# Patient Record
Sex: Female | Born: 1950 | Race: White | Hispanic: No | Smoking: Current every day smoker
Health system: Southern US, Community
[De-identification: ages and names within clinical notes are randomized; demographics above are authoritative.]

---

## 2006-12-17 ENCOUNTER — Inpatient Hospital Stay (HOSPITAL_COMMUNITY): Admission: RE | Admit: 2006-12-17 | Discharge: 2006-12-19 | Payer: Self-pay | Admitting: Neurosurgery

## 2010-10-10 NOTE — Op Note (Signed)
NAMEROZELLE, CAUDLE                  ACCOUNT NO.:  [id number]   MEDICAL RECORD NO.:  [id number]          PATIENT TYPE:  INP   LOCATION:  3172                         FACILITY:  [facility]   PHYSICIAN:  Payton Doughty, M.D.      DATE OF BIRTH:  04/19/1951   DATE OF PROCEDURE:  12/17/2006  DATE OF DISCHARGE:                               OPERATIVE REPORT   PREOPERATIVE DIAGNOSIS:  Spondylosis C5-C6 and C6-C7.   POSTOPERATIVE DIAGNOSIS:  Spondylosis C5-C6 and C6-C7.   PROCEDURE:  C5-C6 and C6-C7 anterior cervical decompression and fusion  with Reflex hybrid plate.   SURGEON:  Payton Doughty, M.D.   SURGEON:  Clydene Fake, M.D.  [place]   ANESTHESIA:  General endotracheal anesthesia.   PREPARATION:  Betadine prep with alcohol wipe.   COMPLICATIONS:  None.   BODY OF TEXT:  This is a 60 year old lady with severe cervical  spondylosis.  She is taken to the operating room, after smoothly  anesthetized and intubated, placed spine on the operating table in  halter head traction.  Following shave, prep, and drape in the usual  sterile fashion, the skin was incised from the midline to the medial  border of the sternocleidomastoid muscle on the left just at the level  of the carotid tubercle.  The platysma was identified, elevated,  divided, and undermined.  Sternocleidomastoid was identified and medial  dissection revealed the carotid artery retracted lateral to the left,  the trachea and esophagus retracted laterally to the right, exposing the  bones of the anterior cervical spine.  A marker was placed.  Intraoperative x-ray obtained to confirm the correctness of the level.  Having confirmed the correctness of the level, a Shadowline self-  retaining retractor was placed and discectomy carried out at C5-C6 and  C6-C7. The microscope was then brought in.  We used microdissection  technique to complete the discectomy, explore the neural foramen, and  divide the posterior annulus and the  posterior longitudinal ligament.  The neural foramen were carefully explored bilaterally and found to be  open.  A 7 mm bone graft was fashioned with patellar allograft and  tapped into place.  A 32 mm Reflex hybrid plate was placed with 12 mm  screws, two at C5, two at C6, and two at C7.  Intraoperative x-ray  showed good placement of bone graft, plate and screws.  Successive  layers of 3-0 Vicryl and 4-0 Vicryl.  Benzoin and Steri-Strips were  placed and made occlusive with Telfa and OpSite.  The patient returned  to the recovery room in good condition.           ______________________________  Payton Doughty, M.D.    MWR/MEDQ  D:  12/17/2006  T:  12/17/2006  Job:  [id number]

## 2010-10-10 NOTE — H&P (Signed)
Catherine Wallace, Catherine Wallace                  ACCOUNT NO.:  [id number]   MEDICAL RECORD NO.:  [id number]          PATIENT TYPE:  INP   LOCATION:  3172                         FACILITY:  [facility]   PHYSICIAN:  Payton Doughty, M.D.      DATE OF BIRTH:  May 21, 1951   DATE OF ADMISSION:  12/17/2006  DATE OF DISCHARGE:                              HISTORY & PHYSICAL   ADMITTING DIAGNOSIS:  Cervical spondylosis at C5-6 and C6-7.   SERVICE:  Neurosurgery.   A 60 year old, right-handed, white lady I saw several years ago for back  pain.  She has had pain in her neck. She visits Dr. Pearlean Brownie. MRI showed a  cervical spondylosis with cord compression. I saw her in the office and  she is now admitted for an anterior decompression and fusion.  Medical  history is benign.  She had a C-section, tubal ligation and ganglion  cyst removed from her right wrist.   MEDICATIONS:  Naprosyn but she is not taking it now.   SOCIAL HISTORY:  Smokes half a pack cigarettes a day, does not drink  alcohol and is on disability.   FAMILY HISTORY:  Mom is 56 with anemia, day is 51 and has had a bypass.   REVIEW OF SYSTEMS:  Remarkable for glasses, nasal congestion, sinus  problems, leg pain, shortness of breath, difficulty with urination, arm  weakness, leg weakness, back pain, arm pain, leg pain, joint pain,  arthritis, neck pain, increased appetite and excessive thirst.   PHYSICAL EXAMINATION:  HEENT:  Within normal limits.  She has limited  range of motion I her neck.  CHEST:  Has diffuse wheezes.  CARDIAC:  Regular rate and rhythm.  ABDOMEN:  Nontender with no hepatosplenomegaly.  EXTREMITIES:  Without clubbing, cyanosis.  GU:  Exam is deferred.  PERIPHERAL PULSES:  Good.  NEUROLOGICALLY:  She is awake, alert and oriented.  Cranial nerves are  intact.  Motor exam shows 5/5 strength throughout the upper extremities  save the right triceps 5-/5. There is no current sensory deficit.  She  is areflexic in the upper  extremities.  Hoffman's is slightly positive  on the right. The lower extremities are not myelopathic.   The MRI that shows the disk at 5-6 and 6-7 with cord compression and  slightly increased signal.   CLINICAL IMPRESSION:  Herniated disc with early cervical myelopathy.   PLAN:  Anterior decompression and fusion at C5-6 and C6-7. The risks and  benefits have been discussed with her and she wishes to proceed.           ______________________________  Payton Doughty, M.D.     MWR/MEDQ  D:  12/17/2006  T:  12/17/2006  Job:  [id number]

## 2011-03-12 LAB — COMPREHENSIVE METABOLIC PANEL
ALT: 15
AST: 19
Albumin: 3.7
CO2: 29
Calcium: 9.4
Creatinine, Ser: 0.67
GFR calc Af Amer: >60
Sodium: 138
Total Protein: 6.5

## 2011-03-12 LAB — DIFFERENTIAL
Eosinophils Absolute: 0.2
Eosinophils Relative: 2
Lymphocytes Relative: 37
Lymphs Abs: 3.6 — ABNORMAL HIGH
Monocytes Absolute: 0.7
Monocytes Relative: 7

## 2011-03-12 LAB — ABO/RH: ABO/RH(D): O NEG

## 2011-03-12 LAB — TYPE AND SCREEN
ABO/RH(D): O NEG
Antibody Screen: NEGATIVE

## 2011-03-12 LAB — CBC
MCHC: 34.4
MCV: 91.7
Platelets: 225
RBC: 4.83
RDW: 12.6

## 2011-03-12 LAB — URINALYSIS, ROUTINE W REFLEX MICROSCOPIC
Glucose, UA: NEGATIVE
Hgb urine dipstick: NEGATIVE
Protein, ur: NEGATIVE
Specific Gravity, Urine: 1.009
Urobilinogen, UA: 0.2

## 2011-03-12 LAB — PROTIME-INR: Prothrombin Time: 12.5

## 2019-01-28 DIAGNOSIS — E039 Hypothyroidism, unspecified: Secondary | ICD-10-CM | POA: Diagnosis not present

## 2019-01-28 DIAGNOSIS — L0390 Cellulitis, unspecified: Secondary | ICD-10-CM | POA: Diagnosis not present

## 2019-01-28 DIAGNOSIS — J449 Chronic obstructive pulmonary disease, unspecified: Secondary | ICD-10-CM | POA: Diagnosis not present

## 2019-01-28 DIAGNOSIS — Z720 Tobacco use: Secondary | ICD-10-CM | POA: Diagnosis not present

## 2019-01-29 DIAGNOSIS — E039 Hypothyroidism, unspecified: Secondary | ICD-10-CM | POA: Diagnosis not present

## 2019-01-29 DIAGNOSIS — I739 Peripheral vascular disease, unspecified: Secondary | ICD-10-CM

## 2019-01-29 DIAGNOSIS — R739 Hyperglycemia, unspecified: Secondary | ICD-10-CM

## 2019-01-29 DIAGNOSIS — E876 Hypokalemia: Secondary | ICD-10-CM

## 2019-01-29 DIAGNOSIS — F17210 Nicotine dependence, cigarettes, uncomplicated: Secondary | ICD-10-CM | POA: Diagnosis not present

## 2019-01-29 DIAGNOSIS — J449 Chronic obstructive pulmonary disease, unspecified: Secondary | ICD-10-CM | POA: Diagnosis not present

## 2019-01-29 DIAGNOSIS — L03116 Cellulitis of left lower limb: Secondary | ICD-10-CM

## 2019-01-30 DIAGNOSIS — J449 Chronic obstructive pulmonary disease, unspecified: Secondary | ICD-10-CM | POA: Diagnosis not present

## 2019-01-30 DIAGNOSIS — E039 Hypothyroidism, unspecified: Secondary | ICD-10-CM | POA: Diagnosis not present

## 2019-01-30 DIAGNOSIS — F17210 Nicotine dependence, cigarettes, uncomplicated: Secondary | ICD-10-CM | POA: Diagnosis not present

## 2019-01-30 DIAGNOSIS — L03116 Cellulitis of left lower limb: Secondary | ICD-10-CM | POA: Diagnosis not present

## 2019-01-31 DIAGNOSIS — L03116 Cellulitis of left lower limb: Secondary | ICD-10-CM | POA: Diagnosis not present

## 2019-01-31 DIAGNOSIS — J449 Chronic obstructive pulmonary disease, unspecified: Secondary | ICD-10-CM | POA: Diagnosis not present

## 2019-01-31 DIAGNOSIS — E039 Hypothyroidism, unspecified: Secondary | ICD-10-CM | POA: Diagnosis not present

## 2019-01-31 DIAGNOSIS — F17210 Nicotine dependence, cigarettes, uncomplicated: Secondary | ICD-10-CM | POA: Diagnosis not present

## 2019-02-05 DIAGNOSIS — I878 Other specified disorders of veins: Secondary | ICD-10-CM

## 2019-02-05 DIAGNOSIS — Z789 Other specified health status: Secondary | ICD-10-CM | POA: Diagnosis not present

## 2019-02-05 DIAGNOSIS — E039 Hypothyroidism, unspecified: Secondary | ICD-10-CM | POA: Diagnosis not present

## 2019-02-05 DIAGNOSIS — L03116 Cellulitis of left lower limb: Secondary | ICD-10-CM | POA: Diagnosis not present

## 2019-02-06 DIAGNOSIS — I878 Other specified disorders of veins: Secondary | ICD-10-CM | POA: Diagnosis not present

## 2019-02-06 DIAGNOSIS — Z789 Other specified health status: Secondary | ICD-10-CM | POA: Diagnosis not present

## 2019-02-06 DIAGNOSIS — L03116 Cellulitis of left lower limb: Secondary | ICD-10-CM | POA: Diagnosis not present

## 2019-02-06 DIAGNOSIS — E039 Hypothyroidism, unspecified: Secondary | ICD-10-CM | POA: Diagnosis not present

## 2019-02-07 DIAGNOSIS — I878 Other specified disorders of veins: Secondary | ICD-10-CM | POA: Diagnosis not present

## 2019-02-07 DIAGNOSIS — E039 Hypothyroidism, unspecified: Secondary | ICD-10-CM | POA: Diagnosis not present

## 2019-02-07 DIAGNOSIS — Z789 Other specified health status: Secondary | ICD-10-CM | POA: Diagnosis not present

## 2019-02-07 DIAGNOSIS — L03116 Cellulitis of left lower limb: Secondary | ICD-10-CM | POA: Diagnosis not present

## 2019-02-08 DIAGNOSIS — I878 Other specified disorders of veins: Secondary | ICD-10-CM | POA: Diagnosis not present

## 2019-02-08 DIAGNOSIS — E039 Hypothyroidism, unspecified: Secondary | ICD-10-CM | POA: Diagnosis not present

## 2019-02-08 DIAGNOSIS — L03116 Cellulitis of left lower limb: Secondary | ICD-10-CM | POA: Diagnosis not present

## 2019-02-08 DIAGNOSIS — Z789 Other specified health status: Secondary | ICD-10-CM | POA: Diagnosis not present

## 2019-03-04 ENCOUNTER — Emergency Department (HOSPITAL_COMMUNITY): Payer: Medicare Other

## 2019-03-04 ENCOUNTER — Encounter (HOSPITAL_COMMUNITY): Payer: Self-pay | Admitting: Emergency Medicine

## 2019-03-04 ENCOUNTER — Other Ambulatory Visit: Payer: Self-pay

## 2019-03-04 ENCOUNTER — Inpatient Hospital Stay (HOSPITAL_COMMUNITY)
Admission: EM | Admit: 2019-03-04 | Discharge: 2019-03-10 | DRG: 683 | Disposition: A | Discharge status: Skilled Nursing Facility | Payer: Medicare Other | Attending: Internal Medicine | Admitting: Internal Medicine

## 2019-03-04 DIAGNOSIS — R32 Unspecified urinary incontinence: Secondary | ICD-10-CM | POA: Diagnosis present

## 2019-03-04 DIAGNOSIS — L270 Generalized skin eruption due to drugs and medicaments taken internally: Secondary | ICD-10-CM | POA: Diagnosis present

## 2019-03-04 DIAGNOSIS — Z882 Allergy status to sulfonamides status: Secondary | ICD-10-CM | POA: Diagnosis not present

## 2019-03-04 DIAGNOSIS — Z20828 Contact with and (suspected) exposure to other viral communicable diseases: Secondary | ICD-10-CM | POA: Diagnosis present

## 2019-03-04 DIAGNOSIS — R21 Rash and other nonspecific skin eruption: Secondary | ICD-10-CM | POA: Diagnosis not present

## 2019-03-04 DIAGNOSIS — M25551 Pain in right hip: Secondary | ICD-10-CM

## 2019-03-04 DIAGNOSIS — M4802 Spinal stenosis, cervical region: Secondary | ICD-10-CM | POA: Diagnosis present

## 2019-03-04 DIAGNOSIS — R296 Repeated falls: Secondary | ICD-10-CM | POA: Diagnosis present

## 2019-03-04 DIAGNOSIS — N179 Acute kidney failure, unspecified: Principal | ICD-10-CM | POA: Diagnosis present

## 2019-03-04 DIAGNOSIS — L03116 Cellulitis of left lower limb: Secondary | ICD-10-CM | POA: Diagnosis present

## 2019-03-04 DIAGNOSIS — T50905A Adverse effect of unspecified drugs, medicaments and biological substances, initial encounter: Secondary | ICD-10-CM | POA: Diagnosis present

## 2019-03-04 DIAGNOSIS — M48061 Spinal stenosis, lumbar region without neurogenic claudication: Secondary | ICD-10-CM | POA: Diagnosis present

## 2019-03-04 DIAGNOSIS — G629 Polyneuropathy, unspecified: Secondary | ICD-10-CM

## 2019-03-04 DIAGNOSIS — G603 Idiopathic progressive neuropathy: Secondary | ICD-10-CM | POA: Diagnosis present

## 2019-03-04 DIAGNOSIS — Z79890 Hormone replacement therapy (postmenopausal): Secondary | ICD-10-CM

## 2019-03-04 DIAGNOSIS — L304 Erythema intertrigo: Secondary | ICD-10-CM | POA: Diagnosis present

## 2019-03-04 DIAGNOSIS — R234 Changes in skin texture: Secondary | ICD-10-CM | POA: Diagnosis not present

## 2019-03-04 DIAGNOSIS — Y92009 Unspecified place in unspecified non-institutional (private) residence as the place of occurrence of the external cause: Secondary | ICD-10-CM | POA: Diagnosis not present

## 2019-03-04 DIAGNOSIS — Z880 Allergy status to penicillin: Secondary | ICD-10-CM | POA: Diagnosis not present

## 2019-03-04 DIAGNOSIS — W19XXXA Unspecified fall, initial encounter: Secondary | ICD-10-CM

## 2019-03-04 DIAGNOSIS — Z79899 Other long term (current) drug therapy: Secondary | ICD-10-CM | POA: Diagnosis not present

## 2019-03-04 DIAGNOSIS — R531 Weakness: Secondary | ICD-10-CM | POA: Diagnosis not present

## 2019-03-04 LAB — CBC WITH DIFFERENTIAL/PLATELET
Abs Immature Granulocytes: 0.07 10*3/uL (ref 0.00–0.07)
Basophils Absolute: 0 10*3/uL (ref 0.0–0.1)
Basophils Relative: 0 %
Eosinophils Absolute: 0 10*3/uL (ref 0.0–0.5)
Eosinophils Relative: 0 %
HCT: 39.5 % (ref 36.0–46.0)
Hemoglobin: 12.5 g/dL (ref 12.0–15.0)
Immature Granulocytes: 1 %
Lymphocytes Relative: 6 %
Lymphs Abs: 0.7 10*3/uL (ref 0.7–4.0)
MCH: 28.8 pg (ref 26.0–34.0)
MCHC: 31.6 g/dL (ref 30.0–36.0)
MCV: 91 fL (ref 80.0–100.0)
Monocytes Absolute: 0.6 10*3/uL (ref 0.1–1.0)
Monocytes Relative: 5 %
Neutro Abs: 10 10*3/uL — ABNORMAL HIGH (ref 1.7–7.7)
Neutrophils Relative %: 88 %
Platelets: 196 10*3/uL (ref 150–400)
RBC: 4.34 MIL/uL (ref 3.87–5.11)
RDW: 13.9 % (ref 11.5–15.5)
WBC: 11.5 10*3/uL — ABNORMAL HIGH (ref 4.0–10.5)
nRBC: 0 % (ref 0.0–0.2)

## 2019-03-04 LAB — URINALYSIS, ROUTINE W REFLEX MICROSCOPIC
Bilirubin Urine: NEGATIVE
Glucose, UA: NEGATIVE mg/dL
Hgb urine dipstick: NEGATIVE
Ketones, ur: 5 mg/dL — AB
Leukocytes,Ua: NEGATIVE
Nitrite: NEGATIVE
Protein, ur: 30 mg/dL — AB
Specific Gravity, Urine: 1.023 (ref 1.005–1.030)
pH: 5 (ref 5.0–8.0)

## 2019-03-04 LAB — COMPREHENSIVE METABOLIC PANEL
ALT: 40 U/L (ref 0–44)
AST: 88 U/L — ABNORMAL HIGH (ref 15–41)
Albumin: 2.2 g/dL — ABNORMAL LOW (ref 3.5–5.0)
Alkaline Phosphatase: 86 U/L (ref 38–126)
Anion gap: 14 (ref 5–15)
BUN: 49 mg/dL — ABNORMAL HIGH (ref 8–23)
CO2: 22 mmol/L (ref 22–32)
Calcium: 8.4 mg/dL — ABNORMAL LOW (ref 8.9–10.3)
Chloride: 101 mmol/L (ref 98–111)
Creatinine, Ser: 1.35 mg/dL — ABNORMAL HIGH (ref 0.44–1.00)
GFR calc Af Amer: 46 mL/min — ABNORMAL LOW (ref >60)
GFR calc non Af Amer: 40 mL/min — ABNORMAL LOW (ref >60)
Glucose, Bld: 94 mg/dL (ref 70–99)
Potassium: 4.5 mmol/L (ref 3.5–5.1)
Sodium: 137 mmol/L (ref 135–145)
Total Bilirubin: 0.9 mg/dL (ref 0.3–1.2)
Total Protein: 5.6 g/dL — ABNORMAL LOW (ref 6.5–8.1)

## 2019-03-04 LAB — CK: Total CK: 792 U/L — ABNORMAL HIGH (ref 38–234)

## 2019-03-04 MED ORDER — ONDANSETRON HCL 4 MG/2ML IJ SOLN: 4.0000 mg | Freq: Four times a day (QID) | INTRAMUSCULAR | Status: DC | PRN

## 2019-03-04 MED ORDER — OXYBUTYNIN CHLORIDE ER 5 MG PO TB24
5.0000 mg | ORAL_TABLET | Freq: Every day | ORAL | Status: DC
  Administered 2019-03-05 – 2019-03-10 (×6): 5 mg via ORAL
  Filled 2019-03-04 (×7): qty 1

## 2019-03-04 MED ORDER — ONDANSETRON HCL 4 MG PO TABS: 4.0000 mg | ORAL_TABLET | Freq: Four times a day (QID) | ORAL | Status: DC | PRN

## 2019-03-04 MED ORDER — CEFAZOLIN SODIUM-DEXTROSE 1-4 GM/50ML-% IV SOLN
1.0000 g | Freq: Three times a day (TID) | INTRAVENOUS | Status: DC
  Administered 2019-03-04 – 2019-03-05 (×3): 1 g via INTRAVENOUS
  Filled 2019-03-04 (×4): qty 50

## 2019-03-04 MED ORDER — MORPHINE SULFATE (PF) 2 MG/ML IV SOLN
2.0000 mg | INTRAVENOUS | Status: DC | PRN
  Administered 2019-03-04 – 2019-03-05 (×2): 2 mg via INTRAVENOUS
  Filled 2019-03-04 (×3): qty 1

## 2019-03-04 MED ORDER — ENOXAPARIN SODIUM 40 MG/0.4ML ~~LOC~~ SOLN
40.0000 mg | SUBCUTANEOUS | Status: DC
  Administered 2019-03-04 – 2019-03-09 (×6): 40 mg via SUBCUTANEOUS
  Filled 2019-03-04 (×7): qty 0.4

## 2019-03-04 MED ORDER — ACETAMINOPHEN 325 MG PO TABS: 650.0000 mg | ORAL_TABLET | Freq: Four times a day (QID) | ORAL | Status: DC | PRN

## 2019-03-04 MED ORDER — LEVOTHYROXINE SODIUM 25 MCG PO TABS
25.0000 ug | ORAL_TABLET | Freq: Every day | ORAL | Status: DC
  Administered 2019-03-05 – 2019-03-10 (×6): 25 ug via ORAL
  Filled 2019-03-04 (×6): qty 1

## 2019-03-04 MED ORDER — HYDROCODONE-ACETAMINOPHEN 5-325 MG PO TABS
1.0000 | ORAL_TABLET | ORAL | Status: DC | PRN
  Administered 2019-03-05 – 2019-03-10 (×11): 2 via ORAL
  Filled 2019-03-04 (×12): qty 2

## 2019-03-04 MED ORDER — SODIUM CHLORIDE 0.9 % IV SOLN
INTRAVENOUS | Status: DC
  Administered 2019-03-04 – 2019-03-06 (×3): via INTRAVENOUS

## 2019-03-04 MED ORDER — SODIUM CHLORIDE 0.9 % IV BOLUS
1000.0000 mL | Freq: Once | INTRAVENOUS | Status: AC
Start: 2019-03-04 — End: 2019-03-04
  Administered 2019-03-04: 09:00:00 1000 mL via INTRAVENOUS

## 2019-03-04 MED ORDER — GERHARDT'S BUTT CREAM
TOPICAL_CREAM | Freq: Three times a day (TID) | CUTANEOUS | Status: DC
  Administered 2019-03-04 – 2019-03-05 (×2): via TOPICAL
  Administered 2019-03-05: 1 via TOPICAL
  Administered 2019-03-05 – 2019-03-10 (×14): via TOPICAL
  Filled 2019-03-04 (×6): qty 1

## 2019-03-04 MED ORDER — ACETAMINOPHEN 650 MG RE SUPP: 650.0000 mg | Freq: Four times a day (QID) | RECTAL | Status: DC | PRN

## 2019-03-04 MED ORDER — POLYVINYL ALCOHOL 1.4 % OP SOLN
1.0000 [drp] | Freq: Three times a day (TID) | OPHTHALMIC | Status: DC | PRN
  Filled 2019-03-04: qty 15

## 2019-03-04 MED ORDER — SODIUM CHLORIDE 0.9 % IV BOLUS
1000.0000 mL | Freq: Once | INTRAVENOUS | Status: AC
  Administered 2019-03-04: 11:00:00 1000 mL via INTRAVENOUS

## 2019-03-04 NOTE — H&P (Addendum)
[facility]                                                                                    Patient Demographics  Catherine Wallace, is a 68 y.o. female  CSN: [id number]  MRN: [id number]  DOB - 1950-08-25  Admit Date - 03/04/2019  Outpatient Primary MD for the patient is Gordan Payment., MD   With History of -  History reviewed. No pertinent past medical history.    History reviewed. No pertinent surgical history.  in for   Chief Complaint  Patient presents with  . Fall     HPI  Catherine Wallace  is a 68 y.o. female, who was admitted to [facility] Burn unit on sept 23 for 1 week due to intertriginous and flexural exanthema with severe left leg cellulitis/exanthema and desquamation, drug-related, presenting with falls X 2 times yesterday .  Patient also has a history of idiopathic progressive polyneuropathy with spinal stenosis.  She has been having wound care as outpt in [place] and home health changes wound dressing twice a week for her left leg wound/cellulitis/desquamation.  Yesterday she fell and she could not get up from the floor for 6 hours.  She lives alone .  Later on she developed an enterococcal rash and she was started on antifungal cream. In the emergency room she was found to be in acute renal failure and her CK was slightly elevated at 792 . Case discussed with wound care in our facility by the emergency room physician and we decided to admit for further care. Patient has severe pain upon minimal movement .    Review of Systems    In addition to the HPI above,  No Fever-chills, No Headache, No changes with Vision or hearing, No problems swallowing food or Liquids, No Chest pain, Cough or Shortness of Breath, No Abdominal pain, No Nausea or Vommitting, Bowel movements arewatery, No Blood in stool or Urine, No dysuria, No new joints pains-aches,  No new weakness, tingling, numbness in any extremity, No recent weight gain or loss, No polyuria, polydypsia  or polyphagia, No significant Mental Stressors.  A full 10 point Review of Systems was done, except as stated above, all other Review of Systems were negative.   Social History Social History   Tobacco Use  . Smoking status: Not on file  Substance Use Topics  . Alcohol use: Not on file     Family History No family history on file.   Prior to Admission medications   Medication Sig Start Date End Date Taking? Authorizing Provider  carboxymethylcellulose (REFRESH PLUS) 0.5 % SOLN Place 1 drop into both eyes 3 (three) times daily as needed.   Yes [provider]  levothyroxine (SYNTHROID) 25 MCG tablet Take 25 mcg by mouth daily before breakfast.   Yes [provider]  oxybutynin (DITROPAN-XL) 5 MG 24 hr tablet Take 5 mg by mouth daily. 12/11/18  Yes [provider]    Allergies  Allergen Reactions  . Tape Rash  . Penicillins Nausea And Vomiting and Nausea Only  . Sulfamethoxazole-Trimethoprim Rash    Physical Exam  Vitals  Blood pressure (!) 114/56, pulse  99, temperature 98 F (36.7 C), temperature source Oral, resp. rate 20, SpO2 98 %.   General appearance, pleasant elderly female in pain, well-developed HEENT no jaundice or pallor, no facial deviation oral thrush Neck supple, no neck vein distention Chest clear and resonant,  Heart normal S1-S2, no murmurs gallops or rubs Abdomen soft, nontender bowel sounds present, no visceromegaly Extremities no clubbing or cyanosis.  Left ankle wound noted Skin multiple yeast like rashes involving the intertroigal area and perianal area as well Neuro nonfocal, patient moving all extremities  Data Review  CBC Recent Labs  Lab 03/04/19 0942  WBC 11.5*  HGB 12.5  HCT 39.5  PLT 196  MCV 91.0  MCH 28.8  MCHC 31.6  RDW 13.9  LYMPHSABS 0.7  MONOABS 0.6  EOSABS 0.0  BASOSABS 0.0    ------------------------------------------------------------------------------------------------------------------  Chemistries  Recent Labs  Lab 03/04/19 0942  NA 137  K 4.5  CL 101  CO2 22  GLUCOSE 94  BUN 49*  CREATININE 1.35*  CALCIUM 8.4*  AST 88*  ALT 40  ALKPHOS 86  BILITOT 0.9   ------------------------------------------------------------------------------------------------------------------ CrCl cannot be calculated (Unknown ideal weight.). ------------------------------------------------------------------------------------------------------------------ No results for input(s): TSH, T4TOTAL, T3FREE, THYROIDAB in the last 72 hours.  Invalid input(s): FREET3   Coagulation profile No results for input(s): INR, PROTIME in the last 168 hours. ------------------------------------------------------------------------------------------------------------------- No results for input(s): DDIMER in the last 72 hours. -------------------------------------------------------------------------------------------------------------------  Cardiac Enzymes No results for input(s): CKMB, TROPONINI, MYOGLOBIN in the last 168 hours.  Invalid input(s): CK ------------------------------------------------------------------------------------------------------------------ Invalid input(s): POCBNP   ---------------------------------------------------------------------------------------------------------------  Urinalysis    Component Value Date/Time   COLORURINE AMBER (A) 03/04/2019 0746   APPEARANCEUR HAZY (A) 03/04/2019 0746   LABSPEC 1.023 03/04/2019 0746   PHURINE 5.0 03/04/2019 0746   GLUCOSEU NEGATIVE 03/04/2019 0746   HGBUR NEGATIVE 03/04/2019 0746   BILIRUBINUR NEGATIVE 03/04/2019 0746   KETONESUR 5 (A) 03/04/2019 0746   PROTEINUR 30 (A) 03/04/2019 0746   UROBILINOGEN 0.2 12/13/2006 1249   NITRITE NEGATIVE 03/04/2019 0746   LEUKOCYTESUR NEGATIVE 03/04/2019 0746     ----------------------------------------------------------------------------------------------------------------   Imaging results:   Ct Hip Right Wo Contrast  Result Date: 03/04/2019 CLINICAL DATA:  Right hip pain. The patient has fallen 3 times in the last 2 days. EXAM: CT OF THE RIGHT HIP WITHOUT CONTRAST TECHNIQUE: Multidetector CT imaging of the right hip was performed according to the standard protocol. Multiplanar CT image reconstructions were also generated. COMPARISON:  Radiographs dated 03/03/2019 and CT scan of the abdomen and pelvis dated 02/17/2019 FINDINGS: Bones/Joint/Cartilage There is no fracture, dislocation, or other significant abnormality of the right hip. The visualized pelvic bones are intact. Slight degenerative changes of the inferior aspect of the right SI joint. Muscles and Tendons Normal. Soft tissues Normal. IMPRESSION: No significant abnormality of the right hip. Electronically Signed   By: Lorriane Shire M.D.   On: 03/04/2019 09:38      Assessment & Plan  Left leg wound, cellulitis/desquamation/exanthem-drug related Status post dermatology consultation with biopsy at [facility] patient on local treatment as per wound care nurse Start IV Ancef for possible overlying cellulitis  Acute renal failure Start IV fluids  Idiopathic progressive polyneuropathy with history of spinal stenosis probably the cause of her falls PT/OT evaluation  Intertrigo will desquamation versus yeast infection To be treated as per wound care nurse  Acute pain PRN treatment for now Needs IV morphine for as needed  DVT Prophylaxis  AM Labs Ordered, also please review Full Orders  Code Status full  Disposition Plan: Home  Time spent in minutes : 38 minutes  Condition GUARDED   @SIGNATURE @

## 2019-03-04 NOTE — Consult Note (Signed)
[place] Nurse wound consult note Patient receiving care in [place] while awaiting SNF placement.  Consult completed remotely via review of record, including images. Reason for Consult: "wound care" Wound type: SEVERE MASD, intertriginous dermatitis to upper thighs, buttocks, labia.  Draining wound to LE. See physician note for etiology determination by [facility] Dressing procedure/placement/frequency:  Cleanse buttocks and thigh areas that are red and "blistered" with the No Rinse Spray Cleanser in clean utility.  Gently pat dry. Apply liberal coats of Gerhardt's butt cream to affected sites.  For LE wound: Cleanse lower leg wound with No Rinse Spray Cleanser from clean utility. Gently pat dry. Place as many Aquacel Ag (silver hydrofiber) pads Kellie Simmering [phone]) over the draining areas as necessary to cover the wound.  Then place ABD pads over these and secure with kerlex. Change daily. Monitor the wound area(s) for worsening of condition such as: Signs/symptoms of infection,  Increase in size,  Development of or worsening of odor, Development of pain, or increased pain at the affected locations.  Notify the medical team if any of these develop.  Thank you for the consult. [place] nurse will not follow at this time.  Please re-consult the [facility] team if needed.  Val Riles, RN, MSN, CWOCN, CNS-BC, pager [phone]

## 2019-03-04 NOTE — ED Triage Notes (Signed)
Per EMS, pt from home has had several falls over the past couple of days. Was seen at [place] earlier in the day, sent home, fell again.  She does not report any injuries but wants placement.  Pt is not on blood thinners and does not have pain at this time.  Explains her falls are "slips out of the chair."

## 2019-03-04 NOTE — ED Notes (Signed)
ED TO INPATIENT HANDOFF REPORT  ED Nurse Name and Phone #: [phone]   S Name/Age/Gender Catherine Wallace 68 y.o. female Room/Bed: 013C/013C  Code Status   Code Status: Full Code  Home/SNF/Other Home Patient oriented to: self Is this baseline? Yes   Triage Complete: Triage complete  Chief Complaint fall  Triage Note Per EMS, pt from home has had several falls over the past couple of days. Was seen at [place] earlier in the day, sent home, fell again.  She does not report any injuries but wants placement.  Pt is not on blood thinners and does not have pain at this time.  Explains her falls are "slips out of the chair."   Allergies Allergies  Allergen Reactions  . Tape Rash  . Penicillins Nausea And Vomiting and Nausea Only  . Sulfamethoxazole-Trimethoprim Rash    Level of Care/Admitting Diagnosis ED Disposition    ED Disposition Condition [facility] Area: [place] [[id number]]  Level of Care: Med-Surg [16]  Covid Evaluation: Asymptomatic Screening Protocol (No Symptoms)  Diagnosis: Acute renal failure (ARF) [facility]) [409735]  Admitting Physician: [place] Border Marshal.Browner  Attending Physician: Laren Everts, ALI Marshal.Browner  Estimated length of stay: past midnight tomorrow  Certification:: I certify this patient will need inpatient services for at least 2 midnights  PT Class (Do Not Modify): Inpatient [101]  PT Acc Code (Do Not Modify): Private [1]       B Medical/Surgery History History reviewed. No pertinent past medical history. History reviewed. No pertinent surgical history.   A IV Location/Drains/Wounds Patient Lines/Drains/Airways Status   Active Line/Drains/Airways    Name:   Placement date:   Placement time:   Site:   Days:   Peripheral IV 03/04/19 Left Wrist   03/04/19    0745    Wrist   less than 1          Intake/Output Last 24 hours  Intake/Output Summary (Last 24 hours) at 03/04/2019 1740 Last data filed at 03/04/2019 1616 Gross per  24 hour  Intake 2000 ml  Output 400 ml  Net 1600 ml    Labs/Imaging Results for orders placed or performed during the hospital encounter of 03/04/19 (from the past 48 hour(s))  Urinalysis, Routine w reflex microscopic     Status: Abnormal   Collection Time: 03/04/19  7:46 AM  Result Value Ref Range   Color, Urine AMBER (A) YELLOW    Comment: BIOCHEMICALS MAY BE AFFECTED BY COLOR   APPearance HAZY (A) CLEAR   Specific Gravity, Urine 1.023 1.005 - 1.030   pH 5.0 5.0 - 8.0   Glucose, UA NEGATIVE NEGATIVE mg/dL   Hgb urine dipstick NEGATIVE NEGATIVE   Bilirubin Urine NEGATIVE NEGATIVE   Ketones, ur 5 (A) NEGATIVE mg/dL   Protein, ur 30 (A) NEGATIVE mg/dL   Nitrite NEGATIVE NEGATIVE   Leukocytes,Ua NEGATIVE NEGATIVE   RBC / HPF 0-5 0 - 5 RBC/hpf   WBC, UA 0-5 0 - 5 WBC/hpf   Bacteria, UA RARE (A) NONE SEEN   Squamous Epithelial / LPF 0-5 0 - 5   Mucus PRESENT    Hyaline Casts, UA PRESENT    Granular Casts, UA PRESENT    Amorphous Crystal PRESENT     Comment: Performed at [facility], [address], [place], [place] [address]  CBC with Differential     Status: Abnormal   Collection Time: 03/04/19  9:42 AM  Result Value Ref Range   WBC  11.5 (H) 4.0 - 10.5 K/uL   RBC 4.34 3.87 - 5.11 MIL/uL   Hemoglobin 12.5 12.0 - 15.0 g/dL   HCT 98.9 21.1 - 94.1 %   MCV 91.0 80.0 - 100.0 fL   MCH 28.8 26.0 - 34.0 pg   MCHC 31.6 30.0 - 36.0 g/dL   RDW 74.0 81.4 - 48.1 %   Platelets 196 150 - 400 K/uL   nRBC 0.0 0.0 - 0.2 %   Neutrophils Relative % 88 %   Neutro Abs 10.0 (H) 1.7 - 7.7 K/uL   Lymphocytes Relative 6 %   Lymphs Abs 0.7 0.7 - 4.0 K/uL   Monocytes Relative 5 %   Monocytes Absolute 0.6 0.1 - 1.0 K/uL   Eosinophils Relative 0 %   Eosinophils Absolute 0.0 0.0 - 0.5 K/uL   Basophils Relative 0 %   Basophils Absolute 0.0 0.0 - 0.1 K/uL   Immature Granulocytes 1 %   Abs Immature Granulocytes 0.07 0.00 - 0.07 K/uL    Comment: Performed at [facility], [address], [place], [place] [address]  Comprehensive metabolic panel     Status: Abnormal   Collection Time: 03/04/19  9:42 AM  Result Value Ref Range   Sodium 137 135 - 145 mmol/L   Potassium 4.5 3.5 - 5.1 mmol/L   Chloride 101 98 - 111 mmol/L   CO2 22 22 - 32 mmol/L   Glucose, Bld 94 70 - 99 mg/dL   BUN 49 (H) 8 - 23 mg/dL   Creatinine, Ser 4.97 (H) 0.44 - 1.00 mg/dL   Calcium 8.4 (L) 8.9 - 10.3 mg/dL   Total Protein 5.6 (L) 6.5 - 8.1 g/dL   Albumin 2.2 (L) 3.5 - 5.0 g/dL   AST 88 (H) 15 - 41 U/L   ALT 40 0 - 44 U/L   Alkaline Phosphatase 86 38 - 126 U/L   Total Bilirubin 0.9 0.3 - 1.2 mg/dL   GFR calc non Af Amer 40 (L) >60 mL/min   GFR calc Af Amer 46 (L) >60 mL/min   Anion gap 14 5 - 15    Comment: Performed at [facility], [address] [place], [place] [address]  CK     Status: Abnormal   Collection Time: 03/04/19  9:42 AM  Result Value Ref Range   Total CK 792 (H) 38 - 234 U/L    Comment: Performed at [facility], [address] [place], [place] [address]   Ct Hip Right Wo Contrast  Result Date: 03/04/2019 CLINICAL DATA:  Right hip pain. The patient has fallen 3 times in the last 2 days. EXAM: CT OF THE RIGHT HIP WITHOUT CONTRAST TECHNIQUE: Multidetector CT imaging of the right hip was performed according to the standard protocol. Multiplanar CT image reconstructions were also generated. COMPARISON:  Radiographs dated 03/03/2019 and CT scan of the abdomen and pelvis dated 02/17/2019 FINDINGS: Bones/Joint/Cartilage There is no fracture, dislocation, or other significant abnormality of the right hip. The visualized pelvic bones are intact. Slight degenerative changes of the inferior aspect of the right SI joint. Muscles and Tendons Normal. Soft tissues Normal. IMPRESSION: No significant abnormality of the right hip. Electronically Signed   By: Francene Boyers M.D.   On: 03/04/2019 09:38    Pending Labs Unresulted Labs (From admission, onward)    Start     Ordered    03/05/19 0500  Basic metabolic panel  Tomorrow morning,   R  03/04/19 1243   03/05/19 0500  CBC  Tomorrow morning,   R     03/04/19 1243   03/04/19 1244  HIV Antibody (routine testing w rflx)  (HIV Antibody (Routine testing w reflex) panel)  Once,   STAT     03/04/19 1243   03/04/19 1244  HIV4GL Save Tube  (HIV Antibody (Routine testing w reflex) panel)  Once,   STAT     03/04/19 1243   03/04/19 1133  SARS CORONAVIRUS 2 (TAT 6-24 HRS) Nasopharyngeal Nasopharyngeal Swab  (Asymptomatic/Tier 2 Patients Labs)  Once,   STAT    Question Answer Comment  Is this test for diagnosis or screening Screening   Symptomatic for COVID-19 as defined by CDC No   Hospitalized for COVID-19 No   Admitted to ICU for COVID-19 No   Previously tested for COVID-19 No   Resident in a congregate (group) care setting No   Employed in healthcare setting No   Pregnant No      03/04/19 1133   03/04/19 0723  Urine culture  ONCE - STAT,   STAT     03/04/19 0722          Vitals/Pain Today's Vitals   03/04/19 1530 03/04/19 1545 03/04/19 1600 03/04/19 1613  BP: (!) 125/95 (!) 100/56 (!) 145/58   Pulse: 92 90  98  Resp: 17 18  19   Temp:      TempSrc:      SpO2: 97% 91%  98%  PainSc:        Isolation Precautions No active isolations  Medications Medications  Gerhardt's butt cream (has no administration in time range)  levothyroxine (SYNTHROID) tablet 25 mcg (has no administration in time range)  oxybutynin (DITROPAN-XL) 24 hr tablet 5 mg (has no administration in time range)  carboxymethylcellulose (REFRESH PLUS) 0.5 % ophthalmic solution 1 drop (has no administration in time range)  enoxaparin (LOVENOX) injection 40 mg (has no administration in time range)  0.9 %  sodium chloride infusion ( Intravenous New Bag/Given 03/04/19 1503)  acetaminophen (TYLENOL) tablet 650 mg (has no administration in time range)    Or  acetaminophen (TYLENOL) suppository 650 mg (has no administration in time range)   HYDROcodone-acetaminophen (NORCO/VICODIN) 5-325 MG per tablet 1-2 tablet (has no administration in time range)  ondansetron (ZOFRAN) tablet 4 mg (has no administration in time range)    Or  ondansetron (ZOFRAN) injection 4 mg (has no administration in time range)  morphine 2 MG/ML injection 2 mg (has no administration in time range)  ceFAZolin (ANCEF) IVPB 1 g/50 mL premix (0 g Intravenous Stopped 03/04/19 1616)  sodium chloride 0.9 % bolus 1,000 mL (0 mLs Intravenous Stopped 03/04/19 1111)  sodium chloride 0.9 % bolus 1,000 mL (0 mLs Intravenous Stopped 03/04/19 1503)    Mobility walks High fall risk   Focused Assessments Cardiac Assessment Handoff:    Lab Results  Component Value Date   CKTOTAL 792 (H) 03/04/2019   No results found for: DDIMER Does the Patient currently have chest pain? No     R Recommendations: See Admitting Provider Note  Report given to:   Additional Notes:

## 2019-03-04 NOTE — ED Notes (Signed)
Dinner tray ordered.

## 2019-03-04 NOTE — ED Provider Notes (Signed)
[facility] EMERGENCY DEPARTMENT Provider Note   CSN: [id number] Arrival date & time: 03/04/19  0059     History   Chief Complaint Chief Complaint  Patient presents with  . Fall    HPI Catherine Wallace is a 68 y.o. female.     HPI  Hx of idiopathic prgressive, polyneuropathy, spinal stenosis, recent admission for rash  Presented with perineal rash to [place] 9/22 and was admitted 9/23 to [facility]. She had been in and out of the hospital in early September for a left lower exstremity cellulitis of a wound that she does not recall sustaining, most recent abx completed was clinda prior to that. 9/23 was admitted with symmetric drug related intertriginous and flexural exanthema, was given fluids, wound care and evaluated by burn service. Dermatology consulted biopsied reported Symmetrical Drug related intertriginous and flexural exanthema, supportive care with oral rinses and topicals. Home health nursing and wound evaluation and care planned.  Wounds were to have daily deressing changes, mild soap, twice daily triamcinolone ointment  Since she has been out of the hospital, was initially doing well, but fell yesterday twice. Stood off the chair and fell due to pain from the rash.  After falling, hurt right hip. Has a compression fracture from many years ago.  Thinks was on the floor for 6 hours yesterday trying to get help. Went to [place], had XR and sent home. Feels difficulty walking due to pain from rash and now right hip. Has not been able to bear weight since the fall yesterday  No head trauma, no blood thinners, no LOC No new neck pain or back pain  No fevers, no cough, no urinary symptoms Thinks rash is the same. Has not been putting the ointment on it. [facility].  Was supposed to go Monday but went to hospital. Home health nurse changes dressing twice per week.   Lives alone.  History reviewed. No pertinent past medical  history.  Patient Active Problem List   Diagnosis Date Noted  . Acute renal failure (ARF) (HCC) 03/04/2019    History reviewed. No pertinent surgical history.   OB History   No obstetric history on file.      Home Medications    Prior to Admission medications   Medication Sig Start Date End Date Taking? Authorizing Provider  carboxymethylcellulose (REFRESH PLUS) 0.5 % SOLN Place 1 drop into both eyes 3 (three) times daily as needed.   Yes [provider]  levothyroxine (SYNTHROID) 25 MCG tablet Take 25 mcg by mouth daily before breakfast.   Yes [provider]  oxybutynin (DITROPAN-XL) 5 MG 24 hr tablet Take 5 mg by mouth daily. 12/11/18  Yes [provider]    Family History No family history on file.  Social History Social History   Tobacco Use  . Smoking status: Not on file  Substance Use Topics  . Alcohol use: Not on file  . Drug use: Not on file     Allergies   Tape, Penicillins, and Sulfamethoxazole-trimethoprim   Review of Systems Review of Systems  Constitutional: Negative for fever.  HENT: Negative for sore throat.   Eyes: Negative for visual disturbance.  Respiratory: Negative for cough and shortness of breath.   Cardiovascular: Negative for chest pain.  Gastrointestinal: Negative for abdominal pain, nausea and vomiting.  Genitourinary: Negative for difficulty urinating.  Musculoskeletal: Positive for arthralgias and gait problem. Negative for neck pain.  Skin: Positive for  rash and wound.  Neurological: Negative for syncope and headaches.     Physical Exam Updated Vital Signs BP (!) 114/56   Pulse 99   Temp 98 F (36.7 C) (Oral)   Resp 20   SpO2 98%   Physical Exam Vitals signs and nursing note reviewed.  Constitutional:      General: She is not in acute distress.    Appearance: She is well-developed. She is not diaphoretic.  HENT:     Head: Normocephalic and atraumatic.  Eyes:     Conjunctiva/sclera:  Conjunctivae normal.  Neck:     Musculoskeletal: Normal range of motion.  Cardiovascular:     Rate and Rhythm: Regular rhythm. Tachycardia present.     Heart sounds: Normal heart sounds. No gallop.   Pulmonary:     Effort: Pulmonary effort is normal. No respiratory distress.  Abdominal:     General: There is no distension.     Palpations: Abdomen is soft.     Tenderness: There is no abdominal tenderness. There is no guarding.  Musculoskeletal:        General: No tenderness.     Comments: Pain with ROM of hips  Skin:    General: Skin is warm and dry.     Findings: Erythema (see photos, descuamation of skin around perineum, proximal medial legs, beefy erythema and ulceration, ulceration and erythema LLE) and rash present.  Neurological:     Mental Status: She is alert and oriented to person, place, and time.          ED Treatments / Results  Labs (all labs ordered are listed, but only abnormal results are displayed) Labs Reviewed  CBC WITH DIFFERENTIAL/PLATELET - Abnormal; Notable for the following components:      Result Value   WBC 11.5 (*)    Neutro Abs 10.0 (*)    All other components within normal limits  COMPREHENSIVE METABOLIC PANEL - Abnormal; Notable for the following components:   BUN 49 (*)    Creatinine, Ser 1.35 (*)    Calcium 8.4 (*)    Total Protein 5.6 (*)    Albumin 2.2 (*)    AST 88 (*)    GFR calc non Af Amer 40 (*)    GFR calc Af Amer 46 (*)    All other components within normal limits  URINALYSIS, ROUTINE W REFLEX MICROSCOPIC - Abnormal; Notable for the following components:   Color, Urine AMBER (*)    APPearance HAZY (*)    Ketones, ur 5 (*)    Protein, ur 30 (*)    Bacteria, UA RARE (*)    All other components within normal limits  CK - Abnormal; Notable for the following components:   Total CK 792 (*)    All other components within normal limits  URINE CULTURE  SARS CORONAVIRUS 2 (TAT 6-24 HRS)    EKG None  Radiology Ct Hip Right  Wo Contrast  Result Date: 03/04/2019 CLINICAL DATA:  Right hip pain. The patient has fallen 3 times in the last 2 days. EXAM: CT OF THE RIGHT HIP WITHOUT CONTRAST TECHNIQUE: Multidetector CT imaging of the right hip was performed according to the standard protocol. Multiplanar CT image reconstructions were also generated. COMPARISON:  Radiographs dated 03/03/2019 and CT scan of the abdomen and pelvis dated 02/17/2019 FINDINGS: Bones/Joint/Cartilage There is no fracture, dislocation, or other significant abnormality of the right hip. The visualized pelvic bones are intact. Slight degenerative changes of the inferior aspect of the right  SI joint. Muscles and Tendons Normal. Soft tissues Normal. IMPRESSION: No significant abnormality of the right hip. Electronically Signed   By: Lorriane Shire M.D.   On: 03/04/2019 09:38    Procedures .Suture Removal  Date/Time: 03/04/2019 12:40 PM Performed by: Gareth Morgan, MD Authorized by: Gareth Morgan, MD   Consent:    Consent obtained:  Verbal   Consent given by:  Patient   Risks discussed:  Pain Location:    Location:  Lower extremity   Lower extremity location:  Leg   Leg location:  L upper leg Procedure details:    Number of sutures removed:  1 Post-procedure details:    Patient tolerance of procedure:  Tolerated well, no immediate complications Comments:     Suture present LLE, unclear etiology given no surgery at prior hospital, patient reports must have been placed then (in hospital 9/23)-removed by PA student without complication   (including critical care time)  Medications Ordered in ED Medications  Gerhardt's butt cream (has no administration in time range)  sodium chloride 0.9 % bolus 1,000 mL (0 mLs Intravenous Stopped 03/04/19 1111)  sodium chloride 0.9 % bolus 1,000 mL (1,000 mLs Intravenous Bolus from Bag 03/04/19 1125)     Initial Impression / Assessment and Plan / ED Course  I have reviewed the triage vital signs and the  nursing notes.  Pertinent labs & imaging results that were available during my care of the patient were reviewed by me and considered in my medical decision making (see chart for details).       68 year old female with a history of idiopathic progressive polyneuropathy, lumbar and cervical spinal stenosis, who was in and out of the hospital with a left lower extremity cellulitis, followed by admission September 23 to [facility] with concern for perineal rash with burn service and dermatology involved in diagnosis of symmetric drug-related intertriginous and flexural exanthema who was discharged approximately one week ago and presents with concern for multiple falls yesterday with right hip pain and desire for SNF placement.  Denies headache, head trauma, blood thinners, doubt intracranial bleed.  Reports she was on floor for prolonged period of time yesterday, labwork ordered. Mild tachycardia on exam may be secondary to dehydration, r/o infection.  Wounds appear desquamated but per patient they have not worsened since discharge.   CT ordered for occult hip fracture shows no sign of fracture.  Based on description of wounds from patient and knowing wound care and home health have been seeing her without increased concerns, I feel her wounds while severe appear chronic and appropriate for wound care and have lower suspicion for infection. No sign of UTI. No cough to indicate pneumonia.  WBC 11.5.  Labs significant for creatinine of 1.35 from previous of .5 on 9/28. CK 790. Suspect AKI most likely related to dehydration, although she does have mild CK elevation likely related to downtime from fall.    Consulted hospitalist Dr. Laren Everts for admission for AKI. Ordered IV fluids.  Do not feel she requires [facility] readmission given her wounds have been followed as an outpatient without concerns for worsening and she has had appropriate prior consultation with dermatology.  Wound care consulted.     Final  Clinical Impressions(s) / ED Diagnoses   Final diagnoses:  Fall, initial encounter  Right hip pain  AKI (acute kidney injury) ([place])  Rash  Localized skin desquamation    ED Discharge Orders    None       [place], Junie Panning,  MD 03/04/19 1241

## 2019-03-04 NOTE — Progress Notes (Signed)
Pharmacy Antibiotic Note  Catherine Wallace is a 68 y.o. female admitted on 03/04/2019 after a fall. She has a left leg wound that appears to be cellulitis/desquamation/exanthem. Pharmacy has been consulted for Cefazolin dosing.  Pt is in AKI after being found down for 6 hours. Her creatinine is 1.35 with baseline ~0.4 - 0.5 per care everywhere. Patient's Cr will likely improve with IV hydration. Patient's WBC is wnls and she is currently afebrile.   Plan: Start IV Cefazolin 1g q8 hours. Monitor for development of reaction to cefazolin Monitor renal function, WBCs, Temp, cultures, and clinical status    Temp (24hrs), Avg:98.2 F (36.8 C), Min:97.9 F (36.6 C), Max:98.7 F (37.1 C)  Recent Labs  Lab 03/04/19 0942  WBC 11.5*  CREATININE 1.35*    CrCl cannot be calculated (Unknown ideal weight.).    Allergies  Allergen Reactions  . Tape Rash  . Penicillins Nausea And Vomiting and Nausea Only  . Sulfamethoxazole-Trimethoprim Rash    Antimicrobials this admission: Cefazolin 10/07 >>   Microbiology results: 10/07 UCx: Sent 10/07 COVID: Sent  Thank you for allowing pharmacy to be a part of this patient's care.  Sherren Kerns, PharmD PGY1 Acute Care Pharmacy Resident 03/04/2019 1:55 PM

## 2019-03-04 NOTE — Evaluation (Signed)
Physical Therapy Evaluation Patient Details Name: Catherine Wallace MRN: [id number] DOB: [birth year] Today's Date: 03/04/2019   History of Present Illness  Pt is a 68 y/o female admitted secondary to falls and R hip pain. Imaging for R hip negative for acute abnormality. Pt with AKI, a perineal rash and worsening LLE cellulitis.   Clinical Impression  Pt admitted secondary to problem above with deficits below. Pt presenting with weakness and decreased ROM secondary to pain. Required max A to roll this session, however, pt with increased pain and unable to tolerate further mobility.  Pt currently lives alone and feel she will require increased assist at d/c. Will continue to follow acutely to maximize functional mobility independence and safety.     Follow Up Recommendations SNF;Supervision/Assistance - 24 hour    Equipment Recommendations  None recommended by PT    Recommendations for Other Services       Precautions / Restrictions Precautions Precautions: Fall Restrictions Weight Bearing Restrictions: No      Mobility  Bed Mobility Overal bed mobility: Needs Assistance Bed Mobility: Rolling Rolling: Max assist         General bed mobility comments: Max A to perform partial roll. Pt with increased pain and unable to attempt further mobility.   Transfers                    Ambulation/Gait                Stairs            Wheelchair Mobility    Modified Rankin (Stroke Patients Only)       Balance                                             Pertinent Vitals/Pain Pain Assessment: 0-10 Pain Score: 10-Worst pain ever Pain Location: L hip, R ankle and foot  Pain Descriptors / Indicators: Grimacing;Guarding Pain Intervention(s): Limited activity within patient's tolerance;Monitored during session;Repositioned    Home Living Family/patient expects to be discharged to:: Skilled nursing facility Living Arrangements: Alone   Type of  Home: Apartment Home Access: Level entry     Home Layout: One level Home Equipment: Walker - 2 wheels;Walker - 4 wheels      Prior Function Level of Independence: Independent         Comments: Pt reports she was independent with mobility      Hand Dominance        Extremity/Trunk Assessment   Upper Extremity Assessment Upper Extremity Assessment: Generalized weakness    Lower Extremity Assessment Lower Extremity Assessment: RLE deficits/detail;LLE deficits/detail RLE Deficits / Details: Limited ROM secondary to R hip pain. Only able to perform partial heel slide with assist. Noted rash on upper inner thigh.  LLE Deficits / Details: Increased swelling and redness at calf, ankle and foot. Unable to perform heel slide secondary to pain. Pt reports sharp, shooting pain into LLE.        Communication   Communication: No difficulties  Cognition Arousal/Alertness: Awake/alert Behavior During Therapy: WFL for tasks assessed/performed Overall Cognitive Status: Within Functional Limits for tasks assessed                                        General Comments General comments (  skin integrity, edema, etc.): Worked on positioning of L foot with towel roll under knee at pt request.     Exercises     Assessment/Plan    PT Assessment Patient needs continued PT services  PT Problem List Decreased strength;Decreased balance;Decreased activity tolerance;Decreased range of motion;Decreased mobility;Decreased knowledge of use of DME;Pain       PT Treatment Interventions Gait training;Functional mobility training;Therapeutic activities;Therapeutic exercise;Balance training;Patient/family education    PT Goals (Current goals can be found in the Care Plan section)  Acute Rehab PT Goals Patient Stated Goal: to get stronger before going home PT Goal Formulation: With patient Time For Goal Achievement: 03/18/19 Potential to Achieve Goals: Fair    Frequency Min  2X/week   Barriers to discharge Decreased caregiver support      Co-evaluation               AM-PAC PT "6 Clicks" Mobility  Outcome Measure Help needed turning from your back to your side while in a flat bed without using bedrails?: Total Help needed moving from lying on your back to sitting on the side of a flat bed without using bedrails?: Total Help needed moving to and from a bed to a chair (including a wheelchair)?: Total Help needed standing up from a chair using your arms (e.g., wheelchair or bedside chair)?: Total Help needed to walk in hospital room?: Total Help needed climbing 3-5 steps with a railing? : Total 6 Click Score: 6    End of Session   Activity Tolerance: Patient limited by pain Patient left: in bed;with call bell/phone within reach Nurse Communication: Mobility status PT Visit Diagnosis: Difficulty in walking, not elsewhere classified (R26.2);History of falling (Z91.81);Repeated falls (R29.6);Muscle weakness (generalized) (M62.81);Pain Pain - Right/Left: Left Pain - part of body: Ankle and joints of foot(R hip )    Time: 0350-0938 PT Time Calculation (min) (ACUTE ONLY): 15 min   Charges:   PT Evaluation $PT Eval Moderate Complexity: 1 Mod          Gladys Damme, PT, DPT  Acute Rehabilitation Services  Pager: [phone] Office: [phone]   Lehman Prom 03/04/2019, 4:16 PM

## 2019-03-04 NOTE — ED Notes (Signed)
Pt has wound to left foot and extensive MASD to inner thighs and buttocks area.

## 2019-03-05 ENCOUNTER — Encounter (HOSPITAL_COMMUNITY): Payer: Self-pay

## 2019-03-05 DIAGNOSIS — Y92009 Unspecified place in unspecified non-institutional (private) residence as the place of occurrence of the external cause: Secondary | ICD-10-CM

## 2019-03-05 LAB — CBC
HCT: 33.4 % — ABNORMAL LOW (ref 36.0–46.0)
Hemoglobin: 10.6 g/dL — ABNORMAL LOW (ref 12.0–15.0)
MCH: 28.5 pg (ref 26.0–34.0)
MCHC: 31.7 g/dL (ref 30.0–36.0)
MCV: 89.8 fL (ref 80.0–100.0)
Platelets: 175 10*3/uL (ref 150–400)
RBC: 3.72 MIL/uL — ABNORMAL LOW (ref 3.87–5.11)
RDW: 14.1 % (ref 11.5–15.5)
WBC: 7.7 10*3/uL (ref 4.0–10.5)
nRBC: 0 % (ref 0.0–0.2)

## 2019-03-05 LAB — URINE CULTURE: Culture: <10000 — AB

## 2019-03-05 LAB — BASIC METABOLIC PANEL
Anion gap: 10 (ref 5–15)
BUN: 28 mg/dL — ABNORMAL HIGH (ref 8–23)
CO2: 23 mmol/L (ref 22–32)
Calcium: 7.7 mg/dL — ABNORMAL LOW (ref 8.9–10.3)
Chloride: 107 mmol/L (ref 98–111)
Creatinine, Ser: 0.78 mg/dL (ref 0.44–1.00)
GFR calc Af Amer: >60 mL/min (ref >60)
GFR calc non Af Amer: >60 mL/min (ref >60)
Glucose, Bld: 96 mg/dL (ref 70–99)
Potassium: 3.5 mmol/L (ref 3.5–5.1)
Sodium: 140 mmol/L (ref 135–145)

## 2019-03-05 LAB — SARS CORONAVIRUS 2 (TAT 6-24 HRS): SARS Coronavirus 2: NEGATIVE

## 2019-03-05 LAB — HIV ANTIBODY (ROUTINE TESTING W REFLEX): HIV Screen 4th Generation wRfx: NONREACTIVE

## 2019-03-05 NOTE — Plan of Care (Signed)
  Problem: Activity: Goal: Risk for activity intolerance will decrease Outcome: Progressing   

## 2019-03-05 NOTE — Plan of Care (Signed)
  Problem: Education: Goal: Knowledge of General Education information will improve Description Including pain rating scale, medication(s)/side effects and non-pharmacologic comfort measures Outcome: Progressing   

## 2019-03-05 NOTE — Progress Notes (Signed)
New Admission Note: ? Arrival Method: Stretcher Mental Orientation: Alert and Oriented x 4 Telemetry: None Assessment: Completed Skin: Refer to flowsheet IV: Left Wrist Pain: 4/10  Tubes: Purewick Safety Measures: Safety Fall Prevention Plan discussed with patient. Admission: Completed 5 [place] Orientation: Patient has been orientated to the room, unit and the staff. Family: None at the bedside at this time Orders have been reviewed and are being implemented. Will continue to monitor the patient. Call light has been placed within reach and bed alarm has been activated.  ? Milagros Loll, RN  Phone Number: [phone]

## 2019-03-05 NOTE — Plan of Care (Signed)
  Problem: Activity: Goal: Risk for activity intolerance will decrease Outcome: Progressing   Problem: Pain Managment: Goal: General experience of comfort will improve Outcome: Progressing   Problem: Skin Integrity: Goal: Risk for impaired skin integrity will decrease Outcome: Progressing   Problem: Safety: Goal: Ability to remain free from injury will improve Outcome: Progressing   
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
## 2019-03-06 DIAGNOSIS — R531 Weakness: Secondary | ICD-10-CM

## 2019-03-06 LAB — CBC WITH DIFFERENTIAL/PLATELET
Abs Immature Granulocytes: 0.08 10*3/uL — ABNORMAL HIGH (ref 0.00–0.07)
Basophils Absolute: 0 10*3/uL (ref 0.0–0.1)
Basophils Relative: 1 %
Eosinophils Absolute: 0.4 10*3/uL (ref 0.0–0.5)
Eosinophils Relative: 6 %
HCT: 35.4 % — ABNORMAL LOW (ref 36.0–46.0)
Hemoglobin: 11.6 g/dL — ABNORMAL LOW (ref 12.0–15.0)
Immature Granulocytes: 1 %
Lymphocytes Relative: 12 %
Lymphs Abs: 0.8 10*3/uL (ref 0.7–4.0)
MCH: 29.4 pg (ref 26.0–34.0)
MCHC: 32.8 g/dL (ref 30.0–36.0)
MCV: 89.8 fL (ref 80.0–100.0)
Monocytes Absolute: 0.6 10*3/uL (ref 0.1–1.0)
Monocytes Relative: 8 %
Neutro Abs: 4.7 10*3/uL (ref 1.7–7.7)
Neutrophils Relative %: 72 %
Platelets: 172 10*3/uL (ref 150–400)
RBC: 3.94 MIL/uL (ref 3.87–5.11)
RDW: 14.2 % (ref 11.5–15.5)
WBC: 6.5 10*3/uL (ref 4.0–10.5)
nRBC: 0 % (ref 0.0–0.2)

## 2019-03-06 LAB — BASIC METABOLIC PANEL
Anion gap: 8 (ref 5–15)
BUN: 19 mg/dL (ref 8–23)
CO2: 22 mmol/L (ref 22–32)
Calcium: 8.1 mg/dL — ABNORMAL LOW (ref 8.9–10.3)
Chloride: 110 mmol/L (ref 98–111)
Creatinine, Ser: 0.64 mg/dL (ref 0.44–1.00)
GFR calc Af Amer: >60 mL/min (ref >60)
GFR calc non Af Amer: >60 mL/min (ref >60)
Glucose, Bld: 112 mg/dL — ABNORMAL HIGH (ref 70–99)
Potassium: 3.8 mmol/L (ref 3.5–5.1)
Sodium: 140 mmol/L (ref 135–145)

## 2019-03-06 LAB — MAGNESIUM: Magnesium: 2 mg/dL (ref 1.7–2.4)

## 2019-03-06 NOTE — Care Management Important Message (Signed)
Important Message  Patient Details  Name: Catherine Wallace MRN: [id number] Date of Birth: 04-18-1951   Medicare Important Message Given:  Yes     Leander Tout 03/06/2019, 2:56 PM

## 2019-03-06 NOTE — TOC Initial Note (Signed)
Transition of Care [facility]) - Initial/Assessment Note    Patient Details  Name: Catherine Wallace MRN: [id number] Date of Birth: Jan 29, 1951  Transition of Care [facility]) CM/SW Contact:    Catherine Goldmann, LCSW Phone Number: 03/06/2019, 1:45 PM  Clinical Narrative:  CSW talked with patient at the bedside regarding her discharge disposition and recommendation of ST rehab. Also present in the room was a physical therapist who taking care of patient's leg. CSW was given permission by patient to talk with her. [name] reported that she has never been to a facility for short-term rehab and is in agreement with this plan as she wants to be strong and be able to walk. CSW explained the facility search process, provided patient with a Medicare.gov SNF list and explained the information on the list. When asked, [name] requested that her information be sent to facilities in [place] counties. The physical therapist Windell Moulding provided valuable information and insight to patient regarding rehab at skilled nursing facilities and informed [name] that she has worked at [employer] providing therapy services. Patient and CSW expressed appreciation to therapist for her input.  [name] reported that she lives alone and has 2 sons (one from a different marriage). She indicated that her youngest son helps her out a lot.                     Expected Discharge Plan: Skilled Nursing Facility Barriers to Discharge: Continued Medical Work up   Patient Goals and CMS Choice Patient states their goals for this hospitalization and ongoing recovery are:: Patient stated that she wants to be strong and be able to walk CMS Medicare.gov Compare Post Acute Care list provided to:: Patient Choice offered to / list presented to : Patient  Expected Discharge Plan and Services Expected Discharge Plan: Skilled Nursing Facility In-house Referral: Clinical Social Work Discharge Planning Services: Other - See comment(CSW consult  for SNF placement)   Living arrangements for the past 2 months: Apartment                                     Prior Living Arrangements/Services Living arrangements for the past 2 months: Apartment Lives with:: Self Patient language and need for interpreter reviewed:: No Do you feel safe going back to the place where you live?: No      Need for Family Participation in Patient Care: Yes (Comment) Care giver support system in place?: Yes (comment)   Criminal Activity/Legal Involvement Pertinent to Current Situation/Hospitalization: No - Comment as needed  Activities of Daily Living Home Assistive Devices/Equipment: Dan Humphreys (specify type) ADL Screening (condition at time of admission) Patient's cognitive ability adequate to safely complete daily activities?: Yes Is the patient deaf or have difficulty hearing?: No Does the patient have difficulty seeing, even when wearing glasses/contacts?: No Does the patient have difficulty concentrating, remembering, or making decisions?: No Patient able to express need for assistance with ADLs?: Yes Does the patient have difficulty dressing or bathing?: No Independently performs ADLs?: Yes (appropriate for developmental age) Does the patient have difficulty walking or climbing stairs?: Yes Weakness of Legs: Right Weakness of Arms/Hands: None  Permission Sought/Granted Permission sought to share information with : Facility Industrial/product designer granted to share information with : Yes, Verbal Permission Granted  Share Information with NAME: (Skilled nursing facilities in [place] and [employer])  Emotional Assessment Appearance:: Appears stated age Attitude/Demeanor/Rapport: Engaged Affect (typically observed): Appropriate, Pleasant Orientation: : Oriented to Self, Oriented to Place, Oriented to  Time, Oriented to Situation Alcohol / Substance Use: Tobacco Use, Alcohol Use, Illicit Drugs(Per H&P, this  information not on file) Psych Involvement: No (comment)  Admission diagnosis:  Rash [R21] Right hip pain [M25.551] AKI (acute kidney injury) ([place]) [N17.9] Localized skin desquamation [R23.4] Fall, initial encounter [W19.XXXA] Patient Active Problem List   Diagnosis Date Noted  . Acute renal failure (ARF) ([place]) 03/04/2019  . AKI (acute kidney injury) ([place])   . Fall   . Localized skin desquamation   . Rash    PCP:  Raina Mina., MD Pharmacy:   [id number] [place], [place] - [address] [place] [address] Phone: [phone] Fax: [phone]   Social Determinants of Health (SDOH) Interventions  No SDOH interventions needed at this time.  Readmission Risk Interventions No flowsheet data found.

## 2019-03-06 NOTE — [place] FL2 (Signed)
[place] MEDICAID FL2 LEVEL OF CARE SCREENING TOOL     IDENTIFICATION  Patient Name: Catherine Wallace Birthdate: [birth year] Sex: female Admission Date (Current Location): 03/04/2019  [facility] [place] Number:  [facility]   Facility and Address:         Provider Number: [id number]  Attending Physician Name and Address:  Glade Lloyd, MD  Relative Name and Phone Number:  Alaina, Donati - [phone]    Current Level of Care: SNF Recommended Level of Care: Skilled Nursing Facility Prior Approval Number:    Date Approved/Denied:   PASRR Number: [id number](Eff. 02/20/19)  Discharge Plan: SNF    Current Diagnoses: Patient Active Problem List   Diagnosis Date Noted  . Acute renal failure (ARF) ([facility]) 03/04/2019  . AKI (acute kidney injury) ([facility])   . Fall   . Localized skin desquamation   . Rash     Orientation RESPIRATION BLADDER Height & Weight     Self, Time, Situation, Place  Normal Incontinent, External catheter(catheter placed 03/05/19) Weight: 163 lb 5.8 oz (74.1 kg) Height:  4\' 9"  (144.8 cm)  BEHAVIORAL SYMPTOMS/MOOD NEUROLOGICAL BOWEL NUTRITION STATUS      Incontinent Diet(Regular)  AMBULATORY STATUS COMMUNICATION OF NEEDS Skin   Total Care(Patient was unable to ambulate wtih PT. Has LLE cellulitis) Verbally Other (Comment)(MASD to inner things and buttocks; Anterior bagina and perineum. Stg 2 to left buttocks (ABD and gauze) & right buttocks treated with Gerhardt's cream; Stg 2 pressure injury to left psoterior, proximal thigh-treated with gauze; Cellulitis of LLE)                       Personal Care Assistance Level of Assistance  Bathing, Feeding, Dressing Bathing Assistance: Maximum assistance(Upper body min assist; Lower body mod assist) Feeding assistance: Limited assistance(Assistance with set-up) Dressing Assistance: Maximum assistance(Upper body min assist; Lower body mod assist)     Functional Limitations Info  Sight, Hearing, Speech Sight  Info: Impaired(Wears glasses) Hearing Info: Adequate Speech Info: Adequate    SPECIAL CARE FACTORS FREQUENCY  PT (By licensed PT), OT (By licensed OT)     PT Frequency: Evaluated 10/7 during hospitalization. PT at [facility] Eval and Treat, a minimum of 5 times per week OT Frequency: Evaluated 10/8 during hospitalization. OT at SNF Eval and Treat, a minimum of 5 times per week            Contractures Contractures Info: Not present    Additional Factors Info  Code Status, Allergies Code Status Info: Full Allergies Info: Sulfamethoxazole-Trimethoprim. Tape, Penicillins           Current Medications (03/06/2019):  This is the current hospital active medication list Current Facility-Administered Medications  Medication Dose Route Frequency Provider Last Rate Last Dose  . acetaminophen (TYLENOL) tablet 650 mg  650 mg Oral Q6H PRN 05/06/2019, MD       Or  . acetaminophen (TYLENOL) suppository 650 mg  650 mg Rectal Q6H PRN Carron Curie, MD      . enoxaparin (LOVENOX) injection 40 mg  40 mg Subcutaneous Q24H Carron Curie, MD   40 mg at 03/05/19 2047  . Gerhardt's butt cream   Topical TID 2048, MD      . HYDROcodone-acetaminophen (NORCO/VICODIN) 5-325 MG per tablet 1-2 tablet  1-2 tablet Oral Q4H PRN Carron Curie, MD   2 tablet at 03/06/19 1115  . levothyroxine (SYNTHROID) tablet 25 mcg  25 mcg Oral QAC breakfast 05/06/19, MD   25 mcg at  03/06/19 0934  . morphine 2 MG/ML injection 2 mg  2 mg Intravenous Q4H PRN Merton Border, MD   2 mg at 03/05/19 2146  . ondansetron (ZOFRAN) tablet 4 mg  4 mg Oral Q6H PRN Merton Border, MD       Or  . ondansetron (ZOFRAN) injection 4 mg  4 mg Intravenous Q6H PRN Merton Border, MD      . oxybutynin (DITROPAN-XL) 24 hr tablet 5 mg  5 mg Oral Daily Merton Border, MD   5 mg at 03/06/19 0935  . polyvinyl alcohol (LIQUIFILM TEARS) 1.4 % ophthalmic solution 1 drop  1 drop Both Eyes TID PRN Merton Border, MD         Discharge Medications: Please see discharge  summary for a list of discharge medications.  Relevant Imaging Results:  Relevant Lab Results:   Additional Information [phone]. Wound infor. con't: Cellulitis of LLE; Cracking of left heel.  Sable Feil, LCSW

## 2019-03-06 NOTE — Clinical Social Work Note (Signed)
Patient provided CSW with her facility preference - [facility]. Call made to [facility], admissions director at [facility], and after reviewing clinicals and consulting with her DON, they made a bed offer and can accept patient on Monday if medically stable. They cannot accept patient over the weekend. CSW will continue to follow and hopefully discharge patient to [facility] when medically stable.  Ilena Dieckman Givens, MSW, LCSW Licensed Clinical Social Worker [place] [phone]

## 2019-03-06 NOTE — Consult Note (Signed)
[place] Nurse wound follow up Patient receiving care in [facility] 5M18. 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  The inner upper thighs and buttocks are vastly improved with the use of Gerhardt's butt cream.  The "scalded" skin is beginning to flake off with healthy skin being revealed.  Patient states the areas are not nearly as painful as before. The LLE was recently dressed and the area is still very tender so my assessment of the area was limited by the patient's hesitancy for evaluation, but the Aquacel is absorbing the copious yellow secretions.  There is no odor, and the patient states it seems to be slightly less painful than before we began the ordered treatment. Continue the existing treatment orders with one small modification.  That is, moisten the LLE dressing with saline prior to attempting to remove. Monitor the wound area(s) for worsening of condition such as: Signs/symptoms of infection,  Increase in size,  Development of or worsening of odor, Development of pain, or increased pain at the affected locations.  Notify the medical team if any of these develop.  Thank you for the consult.  Discussed plan of care with the patient and bedside nurse.  [place] nurse will not follow at this time.  Please re-consult the [place] team if needed.  Val Riles, RN, MSN, CWOCN, CNS-BC, pager [phone]

## 2019-03-06 NOTE — Evaluation (Signed)
Occupational Therapy Evaluation Patient Details Name: Catherine Wallace MRN: [id number] DOB: [birth year] Today's Date: 03/06/2019    History of Present Illness 68 y/o female admitted secondary to falls and R hip pain. Imaging for R hip negative for acute abnormality. Pt with AKI, a perineal rash and worsening LLE cellulitis.    Clinical Impression   Pt with decline in function and safety with ADLs and ADL mobility with impaired strength, balance and endurance. Pt is limited by pain in her back and LEs. Pt report that PTA, she lived at home alone and was independent with ADLs/selfcare, home mgt, driving and used a RW for mobility. Pt currently requires max A with bed mobility for rolling and declines sitting EOB and OOB activity due to pain. Pt requires total A with LB selfcare and toileting. Pt planing to d/c to SNF for ST rehab after acute d/c. Pt would benefit from acute OT services to address impairments to maximize level of function and safety    Follow Up Recommendations  SNF    Equipment Recommendations  Other (comment)(TBD at next venue of care)    Recommendations for Other Services       Precautions / Restrictions Precautions Precautions: Fall Precaution Comments: skin breakdown LLE and R side spine pain Restrictions Weight Bearing Restrictions: No      Mobility Bed Mobility Overal bed mobility: Needs Assistance Bed Mobility: Rolling Rolling: Max assist            Transfers                 General transfer comment: unable, declined to try due to pain    Balance                                           ADL either performed or assessed with clinical judgement   ADL Overall ADL's : Needs assistance/impaired Eating/Feeding: Set up;Sitting   Grooming: Wash/dry hands;Wash/dry face;Oral care;Set up;Sitting;Bed level   Upper Body Bathing: Minimal assistance;Bed level   Lower Body Bathing: Total assistance   Upper Body Dressing : Minimal  assistance;Bed level   Lower Body Dressing: Total assistance     Toilet Transfer Details (indicate cue type and reason): unable due to pain Toileting- Clothing Manipulation and Hygiene: Total assistance;Bed level               Vision Baseline Vision/History: Wears glasses Wears Glasses: At all times Patient Visual Report: No change from baseline       Perception     Praxis      Pertinent Vitals/Pain Pain Assessment: Faces Faces Pain Scale: Hurts even more Pain Location: R hip and L lower leg, R LB Pain Descriptors / Indicators: Grimacing;Guarding Pain Intervention(s): Limited activity within patient's tolerance;Monitored during session;Repositioned     Hand Dominance Right   Extremity/Trunk Assessment Upper Extremity Assessment Upper Extremity Assessment: Generalized weakness   Lower Extremity Assessment Lower Extremity Assessment: Defer to PT evaluation       Communication Communication Communication: No difficulties   Cognition Arousal/Alertness: Awake/alert Behavior During Therapy: WFL for tasks assessed/performed Overall Cognitive Status: Within Functional Limits for tasks assessed                                     General Comments  unablt to assess balance at this  time due to pain    Exercises     Shoulder Instructions      Home Living Family/patient expects to be discharged to:: Private residence Living Arrangements: Alone   Type of Home: Apartment Home Access: Level entry     Home Layout: One level     Bathroom Shower/Tub: Chief Strategy Officer: Standard     Home Equipment: Environmental consultant - 2 wheels;Walker - 4 wheels          Prior Functioning/Environment Level of Independence: Independent        Comments: Pt reports she was independent with ADLs/selfcare and used RW for mobility        OT Problem List: Decreased strength;Impaired balance (sitting and/or standing);Pain;Decreased activity  tolerance;Decreased knowledge of use of DME or AE      OT Treatment/Interventions: Self-care/ADL training;DME and/or AE instruction;Therapeutic activities;Therapeutic exercise;Balance training;Patient/family education    OT Goals(Current goals can be found in the care plan section) Acute Rehab OT Goals Patient Stated Goal: to get stronger before going home OT Goal Formulation: With patient Time For Goal Achievement: 03/20/19 Potential to Achieve Goals: Good ADL Goals Pt Will Perform Grooming: with min guard assist;with supervision;sitting Pt Will Perform Upper Body Bathing: with min guard assist;with supervision;with set-up;sitting Pt Will Perform Lower Body Bathing: with max assist;with mod assist;sitting/lateral leans Pt Will Perform Upper Body Dressing: with min guard assist;with supervision;with set-up;sitting Pt Will Transfer to Toilet: with max assist;stand pivot transfer;bedside commode Additional ADL Goal #1: Pt will complete bed mobility mod A to sit EOB for ADL/selfcare tasks  OT Frequency: Min 2X/week   Barriers to D/C: Decreased caregiver support          Co-evaluation              AM-PAC OT "6 Clicks" Daily Activity     Outcome Measure Help from another person eating meals?: None Help from another person taking care of personal grooming?: A Little Help from another person toileting, which includes using toliet, bedpan, or urinal?: Total Help from another person bathing (including washing, rinsing, drying)?: Total Help from another person to put on and taking off regular upper body clothing?: A Little Help from another person to put on and taking off regular lower body clothing?: Total 6 Click Score: 13   End of Session    Activity Tolerance: Patient limited by pain Patient left: in chair;with call bell/phone within reach  OT Visit Diagnosis: Other abnormalities of gait and mobility (R26.89);Muscle weakness (generalized) (M62.81);History of falling  (Z91.81);Pain                Time: 2025-4270 OT Time Calculation (min): 25 min Charges:  OT General Charges $OT Visit: 1 Visit OT Evaluation $OT Eval Moderate Complexity: 1 Mod    Galen Manila 03/06/2019, 2:08 PM
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
## 2019-03-06 NOTE — Progress Notes (Signed)
Physical Therapy Treatment Patient Details Name: Catherine Wallace MRN: [id number] DOB: 08/25/1950 Today's Date: 03/06/2019    History of Present Illness 68 y/o female admitted secondary to falls and R hip pain. Imaging for R hip negative for acute abnormality. Pt with AKI, a perineal rash and worsening LLE cellulitis.     PT Comments    Pt was seen for mobility and strength training, and was not comfortable enough to sit up burt could allow PT to assist ROM to legs.  Additionally was able to reposition legs and increase her comfort as well as to hear her conversation with SW regarding placement.  Pt is motivated to go home and will work well with SNF care to get there.  Follow acutely and could work on transfers to chair with NWB on BLE's with slding AP transfer.  Follow Up Recommendations  SNF;Supervision/Assistance - 24 hour     Equipment Recommendations  None recommended by PT    Recommendations for Other Services       Precautions / Restrictions Precautions Precautions: Fall Precaution Comments: skin breakdown LLE and R side spine pain Restrictions Weight Bearing Restrictions: No    Mobility  Bed Mobility               General bed mobility comments: declined to get up due to pain  Transfers                    Ambulation/Gait                 Stairs             Wheelchair Mobility    Modified Rankin (Stroke Patients Only)       Balance                                            Cognition Arousal/Alertness: Awake/alert Behavior During Therapy: WFL for tasks assessed/performed Overall Cognitive Status: Within Functional Limits for tasks assessed                                        Exercises General Exercises - Lower Extremity Ankle Circles/Pumps: AROM;AAROM;Both;5 reps Quad Sets: AROM;Both;10 reps Heel Slides: AAROM;Both;10 reps Hip ABduction/ADduction: AAROM;Both;10 reps Straight Leg Raises:  AAROM;Both;5 reps Hip Flexion/Marching: AAROM;Both;10 reps    General Comments General comments (skin integrity, edema, etc.): repositioned legs on pillows to support and unload heels esp since R leg has pain with back as well as hip, and LLE is quite painful with skin breakdown      Pertinent Vitals/Pain Pain Assessment: Faces Faces Pain Scale: Hurts whole lot Pain Location: R hip and L lower leg, R LB Pain Descriptors / Indicators: Grimacing;Guarding Pain Intervention(s): Limited activity within patient's tolerance;Premedicated before session;Repositioned;Patient requesting pain meds-RN notified;Monitored during session    Home Living                      Prior Function            PT Goals (current goals can now be found in the care plan section) Progress towards PT goals: Progressing toward goals    Frequency    Min 2X/week      PT Plan Current plan remains appropriate    Co-evaluation  AM-PAC PT "6 Clicks" Mobility   Outcome Measure  Help needed turning from your back to your side while in a flat bed without using bedrails?: A Lot Help needed moving from lying on your back to sitting on the side of a flat bed without using bedrails?: A Lot Help needed moving to and from a bed to a chair (including a wheelchair)?: A Lot Help needed standing up from a chair using your arms (e.g., wheelchair or bedside chair)?: Total Help needed to walk in hospital room?: Total Help needed climbing 3-5 steps with a railing? : Total 6 Click Score: 9    End of Session   Activity Tolerance: Patient limited by pain Patient left: in bed;with call bell/phone within reach;with bed alarm set Nurse Communication: Mobility status PT Visit Diagnosis: Difficulty in walking, not elsewhere classified (R26.2);History of falling (Z91.81);Repeated falls (R29.6);Muscle weakness (generalized) (M62.81);Pain Pain - Right/Left: (both) Pain - part of body: Ankle and joints of  foot;Hip(R hip and L foot/lower leg)     Time: 6720-9470 PT Time Calculation (min) (ACUTE ONLY): 27 min  Charges:  $Therapeutic Exercise: 8-22 mins $Therapeutic Activity: 8-22 mins                     Ramond Dial 03/06/2019, 1:30 PM   Mee Hives, PT MS Acute Rehab Dept. Number: [place]

## 2019-03-06 NOTE — Plan of Care (Signed)
  Problem: Activity: Goal: Risk for activity intolerance will decrease Outcome: Progressing   

## 2019-03-07 LAB — BASIC METABOLIC PANEL
Anion gap: 7 (ref 5–15)
BUN: 14 mg/dL (ref 8–23)
CO2: 27 mmol/L (ref 22–32)
Calcium: 8.2 mg/dL — ABNORMAL LOW (ref 8.9–10.3)
Chloride: 107 mmol/L (ref 98–111)
Creatinine, Ser: 0.51 mg/dL (ref 0.44–1.00)
GFR calc Af Amer: >60 mL/min (ref >60)
GFR calc non Af Amer: >60 mL/min (ref >60)
Glucose, Bld: 102 mg/dL — ABNORMAL HIGH (ref 70–99)
Potassium: 3.9 mmol/L (ref 3.5–5.1)
Sodium: 141 mmol/L (ref 135–145)

## 2019-03-07 LAB — MAGNESIUM: Magnesium: 1.9 mg/dL (ref 1.7–2.4)

## 2019-03-07 MED ORDER — WHITE PETROLATUM EX OINT
TOPICAL_OINTMENT | CUTANEOUS | Status: AC
  Administered 2019-03-07: 0.2
  Filled 2019-03-07: qty 28.35

## 2019-03-07 NOTE — Plan of Care (Signed)
  Problem: Education: Goal: Knowledge of General Education information will improve Description Including pain rating scale, medication(s)/side effects and non-pharmacologic comfort measures Outcome: Progressing   
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
## 2019-03-07 NOTE — Progress Notes (Signed)
Notified MD Alekh that IV was removed due it leaking and causing pain to the pt. The pt refused to get a new IV placed, MD notified and MD stated that there is no need for IV access at the moment. Pt is not on IVF.   Paulla Fore, RN, BSN

## 2019-03-07 NOTE — Plan of Care (Signed)
  Problem: Safety: Goal: Ability to remain free from injury will improve Outcome: Progressing   

## 2019-03-08 NOTE — TOC Progression Note (Signed)
Transition of Care [facility]) - Progression Note    Patient Details  Name: Catherine Wallace MRN: [id number] Date of Birth: 08/22/1950  Transition of Care [facility]) CM/SW [place], [place] Phone Number: 03/08/2019, 11:31 AM  Clinical Narrative:     CSW met with the patient at bedside and explained that she has a pending bed offer at [facility] if their patient discharges. CSW provided additional bed offers.   CSW will continue to follow and assist with discharge planning.   Expected Discharge Plan: Skilled Nursing Facility Barriers to Discharge: Continued Medical Work up  Expected Discharge Plan and Services Expected Discharge Plan: [place] In-house Referral: Clinical Social Work Discharge Planning Services: Other - See comment(CSW consult for SNF placement)   Living arrangements for the past 2 months: Apartment                                       Social Determinants of Health (SDOH) Interventions    Readmission Risk Interventions No flowsheet data found.

## 2019-03-08 NOTE — TOC Progression Note (Signed)
Transition of Care [facility]) - Progression Note    Patient Details  Name: Kabrea Seeney MRN: [id number] Date of Birth: [birth year]  Transition of Care [facility]) CM/SW [place], [place] Phone Number: 03/08/2019, 8:23 AM  Clinical Narrative:     The patient has a pending bed offer at [facility]. They can possibly admit tomorrow.   CSW will continue to follow and assist with discharge planning.   Expected Discharge Plan: Skilled Nursing Facility Barriers to Discharge: Continued Medical Work up  Expected Discharge Plan and Services Expected Discharge Plan: [place] In-house Referral: Clinical Social Work Discharge Planning Services: Other - See comment(CSW consult for SNF placement)   Living arrangements for the past 2 months: Apartment                                       Social Determinants of Health (SDOH) Interventions    Readmission Risk Interventions No flowsheet data found.

## 2019-03-08 NOTE — Plan of Care (Signed)
  Problem: Activity: Goal: Risk for activity intolerance will decrease Outcome: Not Progressing   
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
## 2019-03-09 MED ORDER — GERHARDT'S BUTT CREAM: 1.0000 "application " | TOPICAL_CREAM | Freq: Three times a day (TID) | CUTANEOUS | 0 refills | Status: DC

## 2019-03-09 MED ORDER — SENNOSIDES 8.6 MG PO TABS: 1.0000 | ORAL_TABLET | Freq: Two times a day (BID) | ORAL | 0 refills | Status: DC

## 2019-03-09 MED ORDER — HYDROCODONE-ACETAMINOPHEN 5-325 MG PO TABS: 1.0000 | ORAL_TABLET | Freq: Four times a day (QID) | ORAL | 0 refills | Status: DC | PRN
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
## 2019-03-09 NOTE — Clinical Social Work Note (Signed)
Call made to [facility], admissions director at [facility] center and they can accept patient pending insurance authorization and negative COVID test with 48 hours of discharge. Clinicals transmitted to [place] for insurance review and authorization. CSW will continue to follow and facilitate discharge to [facility] for [facility] rehab once insurance authorization received.  Aasim Restivo Givens, MSW, LCSW Licensed Clinical Social Worker [place] [phone]

## 2019-03-09 NOTE — Plan of Care (Signed)
  Problem: Education: Goal: Knowledge of General Education information will improve Description Including pain rating scale, medication(s)/side effects and non-pharmacologic comfort measures Outcome: Progressing   

## 2019-03-10 ENCOUNTER — Encounter (HOSPITAL_COMMUNITY): Payer: Self-pay | Admitting: *Deleted

## 2019-03-10 LAB — NOVEL CORONAVIRUS, NAA (HOSP ORDER, SEND-OUT TO REF LAB; TAT 18-24 HRS): SARS-CoV-2, NAA: NOT DETECTED

## 2019-03-10 NOTE — Progress Notes (Signed)
Physical Therapy Treatment Patient Details Name: Catherine Wallace MRN: [id number] DOB: 18-Jul-1950 Today's Date: 03/10/2019    History of Present Illness 68 y/o female admitted secondary to falls and R hip pain. Imaging for R hip negative for acute abnormality. Pt with AKI, a perineal rash and worsening LLE cellulitis.     PT Comments    Pt tolerated sitting up EOB >10 mins, did experience dizziness with changes of position and one posterior LOB in sitting. Attempted partial sit<>stand 3x for pressure to relief to buttocks. Unable to clear buttocks from bed. Performed LE there ex in sitting. Max A for return to supine and total assist for repositioning needed. PT will continue to follow.    Follow Up Recommendations  SNF;Supervision/Assistance - 24 hour     Equipment Recommendations  None recommended by PT    Recommendations for Other Services       Precautions / Restrictions Precautions Precautions: Fall Precaution Comments: skin breakdown LLE and R side spine pain Restrictions Weight Bearing Restrictions: No    Mobility  Bed Mobility Overal bed mobility: Needs Assistance Bed Mobility: Supine to Sit;Sit to Supine     Supine to sit: Max assist Sit to supine: Max assist   General bed mobility comments: max A for LE's off bed and elevation of trunk to sitting. Max A for LE's back into bed. +2 tot for scooting to [facility]. Pt unable to push with LE's to assist with bed mobility due to pain  Transfers Overall transfer level: Needs assistance   Transfers: Sit to/from Stand Sit to Stand: Max assist         General transfer comment: attempted sit<>stand 3x for pressure relief to buttocks. Unable to clear buttocks from bed but did get some relief of pressure and wt through feet.   Ambulation/Gait             General Gait Details: unable   Stairs             Wheelchair Mobility    Modified Rankin (Stroke Patients Only)       Balance Overall balance assessment:  Needs assistance Sitting-balance support: Feet supported Sitting balance-Leahy Scale: Fair Sitting balance - Comments: one poterior LOB in initial sitting, pt reported dizziness Postural control: Posterior lean                                  Cognition Arousal/Alertness: Awake/alert Behavior During Therapy: WFL for tasks assessed/performed Overall Cognitive Status: Within Functional Limits for tasks assessed                                        Exercises General Exercises - Lower Extremity Ankle Circles/Pumps: AROM;Both;10 reps;Supine Long Arc Quad: AROM;Both;5 reps;Seated    General Comments        Pertinent Vitals/Pain Pain Assessment: Faces Faces Pain Scale: Hurts even more Pain Location: L foot and lower leg, R low back Pain Descriptors / Indicators: Grimacing;Guarding Pain Intervention(s): Limited activity within patient's tolerance;Monitored during session;Repositioned    Home Living                      Prior Function            PT Goals (current goals can now be found in the care plan section) Acute Rehab PT Goals Patient Stated  Goal: to get stronger before going home PT Goal Formulation: With patient Time For Goal Achievement: 03/18/19 Potential to Achieve Goals: Fair Progress towards PT goals: Progressing toward goals    Frequency    Min 2X/week      PT Plan Current plan remains appropriate    Co-evaluation              AM-PAC PT "6 Clicks" Mobility   Outcome Measure  Help needed turning from your back to your side while in a flat bed without using bedrails?: A Lot Help needed moving from lying on your back to sitting on the side of a flat bed without using bedrails?: A Lot Help needed moving to and from a bed to a chair (including a wheelchair)?: A Lot Help needed standing up from a chair using your arms (e.g., wheelchair or bedside chair)?: Total Help needed to walk in hospital room?:  Total Help needed climbing 3-5 steps with a railing? : Total 6 Click Score: 9    End of Session   Activity Tolerance: Patient limited by pain;Patient limited by fatigue Patient left: in bed;with call bell/phone within reach Nurse Communication: Mobility status PT Visit Diagnosis: Difficulty in walking, not elsewhere classified (R26.2);History of falling (Z91.81);Repeated falls (R29.6);Muscle weakness (generalized) (M62.81);Pain Pain - Right/Left: (both) Pain - part of body: Ankle and joints of foot;Hip(R hip and L foot/lower leg)     Time: 9937-1696 PT Time Calculation (min) (ACUTE ONLY): 27 min  Charges:  $Therapeutic Activity: 23-37 mins                     Lyanne Co, PT  Acute Rehab Services  Pager [phone] Office [phone]    Lawana Chambers Denorris Reust 03/10/2019, 12:22 PM

## 2019-03-10 NOTE — Progress Notes (Addendum)
Patient ID: Catherine Wallace, female   DOB: 1950-06-01, 68 y.o.   MRN: [id number] Patient is supposed to be discharged to SNF once bed is available/insurance authorization is complete.  Patient is medically stable for discharge.  COVID-19 testing came back negative.  I have seen the patient at bedside.  Please refer to the full discharge summary done by me on 03/09/2019 for full details.

## 2019-03-10 NOTE — TOC Transition Note (Signed)
Transition of Care (TOC) - CM/SW Discharge Note *Discharged to [facility] via ambulance - Room 608   Patient Details  Name: Catherine Wallace MRN: [id number] Date of Birth: 27-Jan-1951  Transition of Care [facility]) CM/SW Contact:  Sable Feil, LCSW Phone Number: 03/10/2019, 1:43 PM   Clinical Narrative:  Patient medically stable for discharge to [facility] and will be transported by ambulance. Patient advised of discharge and gave permission for her son to be called. Attempted to reach Mr. Flavell [phone]) while in room with patient and message left as advised by patient regarding what she will need son to bring to the facility.     Final next level of care: Skilled Nursing Facility([facility]) Barriers to Discharge: No Barriers Identified   Patient Goals and CMS Choice Patient states their goals for this hospitalization and ongoing recovery are:: [name] wants to be able to walk once she returns home CMS Medicare.gov Compare Post Acute Care list provided to:: Patient Choice offered to / list presented to : Patient  Discharge Placement   Existing PASRR number confirmed : 03/06/19          Patient chooses bed at: [facility] Patient to be transferred to facility by: Ambulance Name of family member notified: Jackquline Berlin - called and HIPPA compliant message left Patient and family notified of of transfer: 03/11/19  Discharge Plan and Services In-house Referral: Clinical Social Work Discharge Planning Services: Other - See comment(CSW consult for SNF placement)                                 Social Determinants of Health (SDOH) Interventions  No SDOH interventions needed prior to discharge.    Readmission Risk Interventions No flowsheet data found.

## 2019-03-17 ENCOUNTER — Encounter (HOSPITAL_COMMUNITY): Payer: Self-pay

## 2019-03-17 ENCOUNTER — Emergency Department (HOSPITAL_COMMUNITY)
Admission: EM | Admit: 2019-03-17 | Discharge: 2019-03-18 | Disposition: A | Discharge status: Home / Self Care | Payer: Medicare Other | Attending: Emergency Medicine | Admitting: Emergency Medicine

## 2019-03-17 ENCOUNTER — Other Ambulatory Visit: Payer: Self-pay

## 2019-03-17 DIAGNOSIS — R21 Rash and other nonspecific skin eruption: Secondary | ICD-10-CM | POA: Insufficient documentation

## 2019-03-17 DIAGNOSIS — Z79899 Other long term (current) drug therapy: Secondary | ICD-10-CM | POA: Insufficient documentation

## 2019-03-17 DIAGNOSIS — N390 Urinary tract infection, site not specified: Secondary | ICD-10-CM

## 2019-03-17 DIAGNOSIS — F17210 Nicotine dependence, cigarettes, uncomplicated: Secondary | ICD-10-CM | POA: Diagnosis not present

## 2019-03-17 DIAGNOSIS — N3000 Acute cystitis without hematuria: Secondary | ICD-10-CM | POA: Diagnosis not present

## 2019-03-17 LAB — COMPREHENSIVE METABOLIC PANEL
ALT: 13 U/L (ref 0–44)
AST: 17 U/L (ref 15–41)
Albumin: 1.8 g/dL — ABNORMAL LOW (ref 3.5–5.0)
Alkaline Phosphatase: 67 U/L (ref 38–126)
Anion gap: 7 (ref 5–15)
BUN: 14 mg/dL (ref 8–23)
CO2: 27 mmol/L (ref 22–32)
Calcium: 7.6 mg/dL — ABNORMAL LOW (ref 8.9–10.3)
Chloride: 101 mmol/L (ref 98–111)
Creatinine, Ser: 0.5 mg/dL (ref 0.44–1.00)
GFR calc Af Amer: >60 mL/min (ref >60)
GFR calc non Af Amer: >60 mL/min (ref >60)
Glucose, Bld: 106 mg/dL — ABNORMAL HIGH (ref 70–99)
Potassium: 3.8 mmol/L (ref 3.5–5.1)
Sodium: 135 mmol/L (ref 135–145)
Total Bilirubin: 0.6 mg/dL (ref 0.3–1.2)
Total Protein: 4.7 g/dL — ABNORMAL LOW (ref 6.5–8.1)

## 2019-03-17 LAB — URINALYSIS, ROUTINE W REFLEX MICROSCOPIC
Bilirubin Urine: NEGATIVE
Glucose, UA: NEGATIVE mg/dL
Ketones, ur: NEGATIVE mg/dL
Nitrite: POSITIVE — AB
Protein, ur: NEGATIVE mg/dL
Specific Gravity, Urine: 1.02 (ref 1.005–1.030)
pH: 5 (ref 5.0–8.0)

## 2019-03-17 LAB — CBC WITH DIFFERENTIAL/PLATELET
Abs Immature Granulocytes: 0.04 10*3/uL (ref 0.00–0.07)
Basophils Absolute: 0.1 10*3/uL (ref 0.0–0.1)
Basophils Relative: 2 %
Eosinophils Absolute: 0.5 10*3/uL (ref 0.0–0.5)
Eosinophils Relative: 9 %
HCT: 34.3 % — ABNORMAL LOW (ref 36.0–46.0)
Hemoglobin: 10.6 g/dL — ABNORMAL LOW (ref 12.0–15.0)
Immature Granulocytes: 1 %
Lymphocytes Relative: 22 %
Lymphs Abs: 1.2 10*3/uL (ref 0.7–4.0)
MCH: 28 pg (ref 26.0–34.0)
MCHC: 30.9 g/dL (ref 30.0–36.0)
MCV: 90.5 fL (ref 80.0–100.0)
Monocytes Absolute: 0.4 10*3/uL (ref 0.1–1.0)
Monocytes Relative: 8 %
Neutro Abs: 3.1 10*3/uL (ref 1.7–7.7)
Neutrophils Relative %: 58 %
Platelets: 164 10*3/uL (ref 150–400)
RBC: 3.79 MIL/uL — ABNORMAL LOW (ref 3.87–5.11)
RDW: 15.4 % (ref 11.5–15.5)
WBC: 5.3 10*3/uL (ref 4.0–10.5)
nRBC: 0 % (ref 0.0–0.2)

## 2019-03-17 MED ORDER — SODIUM CHLORIDE 0.9 % IV SOLN
1.0000 g | Freq: Once | INTRAVENOUS | Status: AC
  Administered 2019-03-17: 1 g via INTRAVENOUS
  Filled 2019-03-17: qty 10

## 2019-03-17 NOTE — ED Triage Notes (Signed)
Pt BIB EMS from [facility]. Pt has been getting treatment for cellulitis in left leg but facility believes she may now have cellulitis in her right as well. Leg is swollen, red, and hot to touch. A&O x4   20G LH Got 650 mg Tylenol around 3pm

## 2019-03-17 NOTE — ED Provider Notes (Signed)
[facility]-EMERGENCY DEPT Provider Note   CSN: [id number] Arrival date & time: 03/17/19  1757     History   Chief Complaint No chief complaint on file.   HPI Catherine Wallace is a 68 y.o. female.     HPI  Patient presents with new rash to her right lower extremity.  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  Was admitted earlier this month for acute renal failure and discharged to skilled nursing facility on 10/12.  States she developed erythematous rash to her right lower extremity similar to that of the left.  She is also had fever though she received Tylenol shortly before coming to the emergency department.  Denies chest pain, shortness of breath or cough.  No abdominal pain.  Patient did have 1 episode of vomiting earlier today.   History reviewed. No pertinent past medical history.  Patient Active Problem List   Diagnosis Date Noted  . Acute renal failure (ARF) (HCC) 03/04/2019  . AKI (acute kidney injury) (HCC)   . Fall   . Localized skin desquamation   . Rash     History reviewed. No pertinent surgical history.   OB History   No obstetric history on file.      Home Medications    Prior to Admission medications   Medication Sig Start Date End Date Taking? Authorizing Provider  acetaminophen (TYLENOL) 325 MG tablet Take 650 mg by mouth every 6 (six) hours as needed for fever.   Yes [provider]  carboxymethylcellulose (REFRESH PLUS) 0.5 % SOLN Place 2 drops into both eyes 3 (three) times daily.    Yes [provider]  cephALEXin (KEFLEX) 250 MG capsule Take 250 mg by mouth 4 (four) times daily.   Yes [provider]  Cholecalciferol (VITAMIN D) 50 MCG (2000 UT) tablet Take 2,000 Units by mouth daily.   Yes [provider]  diphenhydrAMINE (BENADRYL) 25 mg capsule Take 25 mg by mouth every 6 (six) hours as needed for itching.   Yes  [provider]  HYDROcodone-acetaminophen (NORCO/VICODIN) 5-325 MG tablet Take 1 tablet by mouth every 6 (six) hours as needed for moderate pain. 03/09/19  Yes Glade Lloyd, MD  Hydrocortisone (GERHARDT'S BUTT CREAM) CREA Apply 1 application topically 3 (three) times daily. Cleanse buttocks and thigh areas that are red and "blistered" with the No Rinse Spray Cleanser in clean utility.  Gently pat dry. Apply liberal coats of Gerhardt's butt cream to affected sites 03/09/19  Yes Glade Lloyd, MD  levothyroxine (SYNTHROID) 25 MCG tablet Take 25 mcg by mouth daily before breakfast.   Yes [provider]  morphine (MS CONTIN) 15 MG 12 hr tablet Take 15 mg by mouth every 12 (twelve) hours.   Yes [provider]  oxybutynin (DITROPAN-XL) 5 MG 24 hr tablet Take 5 mg by mouth daily. 12/11/18  Yes [provider]  senna (SENOKOT) 8.6 MG tablet Take 1 tablet (8.6 mg total) by mouth 2 (two) times daily. 03/09/19  Yes Glade Lloyd, MD    Family History History reviewed. No pertinent family history.  Social History Social History   Tobacco Use  . Smoking status: Current Every Day Smoker  . Smokeless tobacco: Never Used  Substance Use Topics  . Alcohol use: Not on file  . Drug use: Not on file     Allergies   Tape, Penicillins, and Sulfamethoxazole-trimethoprim   Review of Systems Review of Systems  Constitutional: Positive for  fever. Negative for fatigue.  HENT: Negative for sore throat and trouble swallowing.   Eyes: Negative for visual disturbance.  Respiratory: Negative for cough and shortness of breath.   Cardiovascular: Negative for chest pain and leg swelling.  Gastrointestinal: Positive for nausea and vomiting. Negative for abdominal pain, constipation and diarrhea.  Genitourinary: Negative for dysuria and flank pain.  Musculoskeletal: Positive for myalgias. Negative for back pain, neck pain and neck stiffness.  Skin: Positive for rash.  Negative for wound.  Neurological: Negative for dizziness, weakness, light-headedness, numbness and headaches.  All other systems reviewed and are negative.    Physical Exam Updated Vital Signs BP (!) 101/55 (BP Location: Right Arm)   Pulse 100   Temp 98.5 F (36.9 C) (Oral)   Resp 18   Ht  (1.448 m)   Wt 75 kg   SpO2 96%   BMI 35.78 kg/m   Physical Exam Vitals signs and nursing note reviewed.  Constitutional:      Appearance: Normal appearance. She is well-developed.  HENT:     Head: Normocephalic and atraumatic.     Nose: Nose normal.  Eyes:     Pupils: Pupils are equal, round, and reactive to light.  Neck:     Musculoskeletal: Normal range of motion and neck supple.  Cardiovascular:     Rate and Rhythm: Normal rate and regular rhythm.     Heart sounds: No murmur. No friction rub. No gallop.   Pulmonary:     Effort: Pulmonary effort is normal. No respiratory distress.     Breath sounds: Normal breath sounds. No stridor. No wheezing, rhonchi or rales.  Chest:     Chest wall: No tenderness.  Abdominal:     General: Bowel sounds are normal.     Palpations: Abdomen is soft.     Tenderness: There is no abdominal tenderness. There is no guarding or rebound.  Musculoskeletal: Normal range of motion.        General: No swelling, tenderness, deformity or signs of injury.     Right lower leg: No edema.     Left lower leg: No edema.  Skin:    General: Skin is warm and dry.     Findings: No erythema or rash.     Comments: With nonblanching erythematous rash to the dorsum of the foot extending up the anterior surface of the lower leg.  Rash spares the joint surfaces.  Nonblanching.  No definite tenderness to palpation.  Neurological:     Mental Status: She is alert and oriented to person, place, and time.  Psychiatric:        Behavior: Behavior normal.      ED Treatments / Results  Labs (all labs ordered are listed, but only abnormal results are displayed) Labs  Reviewed  CBC WITH DIFFERENTIAL/PLATELET - Abnormal; Notable for the following components:      Result Value   RBC 3.79 (*)    Hemoglobin 10.6 (*)    HCT 34.3 (*)    All other components within normal limits  COMPREHENSIVE METABOLIC PANEL - Abnormal; Notable for the following components:   Glucose, Bld 106 (*)    Calcium 7.6 (*)    Total Protein 4.7 (*)    Albumin 1.8 (*)    All other components within normal limits  URINALYSIS, ROUTINE W REFLEX MICROSCOPIC - Abnormal; Notable for the following components:   APPearance TURBID (*)    Hgb urine dipstick MODERATE (*)    Nitrite POSITIVE (*)  Leukocytes,Ua MODERATE (*)    Bacteria, UA RARE (*)    All other components within normal limits  URINE CULTURE    EKG None  Radiology No results found.  Procedures Procedures (including critical care time)  Medications Ordered in ED Medications  cefTRIAXone (ROCEPHIN) 1 g in sodium chloride 0.9 % 100 mL IVPB (1 g Intravenous New Bag/Given (Non-Interop) 03/17/19 2245)     Initial Impression / Assessment and Plan / ED Course  I have reviewed the triage vital signs and the nursing notes.  Pertinent labs & imaging results that were available during my care of the patient were reviewed by me and considered in my medical decision making (see chart for details).        No evidence of cellulitis.  Patient appears to have UTI.  Urine sent for culture.  Given dose of IV Rocephin.  Patient was recently started on Keflex and advised to continue.  Return precautions given  Final Clinical Impressions(s) / ED Diagnoses   Final diagnoses:  Urinary tract infection without hematuria, site unspecified  Rash and nonspecific skin eruption    ED Discharge Orders    None       Julianne Rice, MD 03/17/19 2359

## 2019-03-17 NOTE — Discharge Instructions (Addendum)
Continue Keflex as prescribed.  Follow-up with your dermatologist.

## 2019-03-18 NOTE — ED Notes (Signed)
[facility] contacted for transport. Paperwork printed and at nursing station.  

## 2019-03-18 NOTE — ED Notes (Addendum)
[facility] at bedside to transport patient to facility. Patient alert and stable at time of transport. Topaz not working properly so patient verbalized signature for discharge.

## 2019-03-23 LAB — URINE CULTURE: Culture: >=100000 — AB

## 2019-03-24 ENCOUNTER — Telehealth: Payer: Self-pay | Admitting: *Deleted

## 2019-03-24 NOTE — Telephone Encounter (Signed)
Post ED Visit - Positive Culture Follow-up: Unsuccessful Patient Follow-up  Culture assessed and recommendations reviewed by:  []  Elenor Quinones, Pharm.D. []  Heide Guile, Pharm.D., BCPS AQ-ID []  Parks Neptune, Pharm.D., BCPS []  Alycia Rossetti, Pharm.D., BCPS []  [place], Pharm.D., BCPS, AAHIVP []  Legrand Como, Pharm.D., BCPS, AAHIVP []  Wynell Balloon, PharmD []  Vincenza Hews, PharmD, BCPS Reuel Boom, Pharm D  Positive urine culture  Plan: If asymptomatic, no treatment.  If symptomatic will need to return to ED for treatment  []  Patient discharged without antimicrobial prescription and treatment is now indicated []  Organism is resistant to prescribed ED discharge antimicrobial []  Patient with positive blood cultures   Unable to contact patient after 3 attempts, letter will be sent to address on file  Ardeen Fillers 03/24/2019, 12:23 PM

## 2019-03-31 ENCOUNTER — Other Ambulatory Visit: Payer: Self-pay

## 2019-03-31 ENCOUNTER — Encounter (HOSPITAL_BASED_OUTPATIENT_CLINIC_OR_DEPARTMENT_OTHER): Payer: Medicare Other | Attending: Internal Medicine | Admitting: Internal Medicine

## 2019-03-31 DIAGNOSIS — R234 Changes in skin texture: Secondary | ICD-10-CM | POA: Diagnosis present

## 2019-03-31 DIAGNOSIS — N179 Acute kidney failure, unspecified: Secondary | ICD-10-CM | POA: Diagnosis not present

## 2019-03-31 DIAGNOSIS — R21 Rash and other nonspecific skin eruption: Secondary | ICD-10-CM | POA: Diagnosis not present

## 2019-03-31 NOTE — Progress Notes (Signed)
Catherine Wallace, Catherine Wallace ([id number]) Visit Report for 03/31/2019 Abuse/Suicide Risk Screen Details Patient Name: Date of Service: Catherine Wallace, Catherine Wallace 03/31/2019 10:30 AM Medical Record [id number] Patient Account Number: [id number] Date of Birth/Sex: Treating RN: July 20, 1950 (68 y.o. Elam Dutch Primary Care Bekim Werntz: Gilford Rile Other Clinician: Referring Aren Cherne: Treating Celisse Ciulla/Extender:Robson, Josefina Do, Mikey [employer] in Treatment: 0 Abuse/Suicide Risk Screen Items Answer ABUSE RISK SCREEN: Has anyone close to you tried to hurt or harm you recentlyo No Do you feel uncomfortable with anyone in your familyo No Has anyone forced you do things that you didnt want to doo No Electronic Signature(s) Signed: 03/31/2019 6:11:25 PM By: Baruch Gouty RN, BSN Entered By: Baruch Gouty on 03/31/2019 11:19:35 -------------------------------------------------------------------------------- Activities of Daily Living Details Patient Name: Date of Service: Catherine Wallace, Catherine Wallace 03/31/2019 10:30 AM Medical Record [id number] Patient Account Number: [id number] Date of Birth/Sex: Treating RN: 01/17/1951 (68 y.o. Elam Dutch Primary Care Palmer Shorey: Gilford Rile Other Clinician: Referring Ashley Bultema: Treating Sameul Tagle/Extender:Robson, Josefina Do, Mikey [employer] in Treatment: 0 Activities of Daily Living Items Answer Activities of Daily Living (Please select one for each item) Drive Automobile Not Able Take Medications Need Assistance Use Telephone Completely Able Care for Appearance Need Assistance Use Toilet Need Assistance Bath / Shower Need Assistance Dress Self Need Assistance Feed Self Completely Able Walk Need Assistance Get In / Out Bed Need Assistance Housework Need Assistance Prepare Meals Need Assistance Handle Money Need Assistance Shop for Self Need Assistance Electronic Signature(s) Signed: 03/31/2019 6:11:25 PM By: Baruch Gouty RN, BSN Entered By: Baruch Gouty on  03/31/2019 11:20:44 -------------------------------------------------------------------------------- Education Screening Details Patient Name: Date of Service: Catherine Wallace, Catherine Wallace 03/31/2019 10:30 AM Medical Record [id number] Patient Account Number: [id number] Date of Birth/Sex: Treating RN: [birth year] (68 y.o. Elam Dutch Primary Care Pascual Mantel: Gilford Rile Other Clinician: Referring Haedyn Ancrum: Treating Ytzel Gubler/Extender:Robson, Josefina Do, Mikey [employer] in Treatment: 0 Primary Learner Assessed: Patient Learning Preferences/Education Level/Primary Language Learning Preference: Explanation, Demonstration, Printed Material Highest Education Level: High School Preferred Language: English Cognitive Barrier Language Barrier: No Translator Needed: No Memory Deficit: No Emotional Barrier: No Cultural/Religious Beliefs Affecting Medical Care: No Physical Barrier Impaired Vision: Yes Glasses Impaired Hearing: No Decreased Hand dexterity: No Knowledge/Comprehension Knowledge Level: High Comprehension Level: High Ability to understand written High instructions: Ability to understand verbal High instructions: Motivation Anxiety Level: Calm Cooperation: Cooperative Education Importance: Acknowledges Need Interest in Health Problems: Asks Questions Perception: Coherent Willingness to Engage in Self- High Management Activities: Readiness to Engage in Self- High Management Activities: Electronic Signature(s) Signed: 03/31/2019 6:11:25 PM By: Baruch Gouty RN, BSN Entered By: Baruch Gouty on 03/31/2019 11:21:27 -------------------------------------------------------------------------------- Fall Risk Assessment Details Patient Name: Date of Service: Catherine Wallace, Catherine Wallace 03/31/2019 10:30 AM Medical Record [id number] Patient Account Number: [id number] Date of Birth/Sex: Treating RN: [birth year] (68 y.o. Elam Dutch Primary Care Hershey Knauer: Gilford Rile Other  Clinician: Referring Ammiel Guiney: Treating Takiesha Mcdevitt/Extender:Robson, Josefina Do, Mikey [employer] in Treatment: 0 Fall Risk Assessment Items Have you had 2 or more falls in the last 12 monthso 0 Yes Have you had any fall that resulted in injury in the last 12 monthso 0 No FALLS RISK SCREEN History of falling - immediate or within 3 months 25 Yes Secondary diagnosis (Do you have 2 or more medical diagnoseso) 0 No Ambulatory aid None/bed rest/wheelchair/nurse 0 No Crutches/cane/walker 15 Yes Furniture 0 No Intravenous therapy Access/Saline/Heparin Lock 0 No Weak (short steps with or without shuffle, stooped but able to lift head 10 Yes while walking, may seek support from furniture) Impaired (short steps with shuffle, may  have difficulty arising from chair, 0 No head down, impaired balance) Mental Status Oriented to own ability 0 Yes Overestimates or forgets limitations 0 No Risk Level: Medium Risk Score: 50 Electronic Signature(s) Signed: 03/31/2019 6:11:25 PM By: Zenaida Deed RN, BSN Entered By: Zenaida Deed on 03/31/2019 11:22:09 -------------------------------------------------------------------------------- Foot Assessment Details Patient Name: Date of Service: Catherine Wallace, Catherine Wallace 03/31/2019 10:30 AM Medical Record [id number] Patient Account Number: [id number] Date of Birth/Sex: Treating RN: Jan 10, 1951 (68 y.o. Tommye Standard Primary Care Adaora Mchaney: Feliciana Rossetti Other Clinician: Referring Lakiesha Ralphs: Treating Bernadette Gores/Extender:Robson, Sheron Nightingale, Ronette Deter in Treatment: 0 Foot Assessment Items Site Locations + = Sensation present, - = Sensation absent, C = Callus, U = Ulcer R = Redness, W = Warmth, M = Maceration, PU = Pre-ulcerative lesion F = Fissure, S = Swelling, D = Dryness Assessment Right: Left: Other Deformity: No No Prior Foot Ulcer: No No Prior Amputation: No No Charcot Joint: No No Ambulatory Status: Ambulatory Without Help Gait: Steady Electronic  Signature(s) Signed: 03/31/2019 6:11:25 PM By: Zenaida Deed RN, BSN Entered By: Zenaida Deed on 03/31/2019 11:24:37 -------------------------------------------------------------------------------- Nutrition Risk Screening Details Patient Name: Date of Service: Catherine Wallace, Catherine Wallace 03/31/2019 10:30 AM Medical Record [id number] Patient Account Number: [id number] Date of Birth/Sex: Treating RN: Oct 27, 1950 (68 y.o. Tommye Standard Primary Care Janyah Singleterry: Feliciana Rossetti Other Clinician: Referring Lashayla Armes: Treating Terrion Poblano/Extender:Robson, Sheron Nightingale, Ronette Deter in Treatment: 0 Height (in): 58 Weight (lbs): 120 Body Mass Index (BMI): 25.1 Nutrition Risk Screening Items Score Screening NUTRITION RISK SCREEN: I have an illness or condition that made me change the kind and/or 0 No amount of food I eat I eat fewer than two meals per day 0 No I eat few fruits and vegetables, or milk products 0 No I have three or more drinks of beer, liquor or wine almost every day 0 No I have tooth or mouth problems that make it hard for me to eat 0 No I don't always have enough money to buy the food I need 0 No I eat alone most of the time 1 Yes I take three or more different prescribed or over-the-counter drugs a day 0 No 0 No Without wanting to, I have lost or gained 10 pounds in the last six months I am not always physically able to shop, cook and/or feed myself 2 Yes Nutrition Protocols Good Risk Protocol Provide education on Moderate Risk Protocol 0 nutrition High Risk Proctocol Risk Level: Moderate Risk Score: 3 Electronic Signature(s) Signed: 03/31/2019 6:11:25 PM By: Zenaida Deed RN, BSN Entered By: Zenaida Deed on 03/31/2019 11:23:14

## 2019-04-01 ENCOUNTER — Emergency Department (HOSPITAL_COMMUNITY): Payer: Medicare Other

## 2019-04-01 ENCOUNTER — Other Ambulatory Visit: Payer: Self-pay

## 2019-04-01 ENCOUNTER — Emergency Department (HOSPITAL_COMMUNITY)
Admission: EM | Admit: 2019-04-01 | Discharge: 2019-04-02 | Disposition: A | Discharge status: Home / Self Care | Payer: Medicare Other | Attending: Emergency Medicine | Admitting: Emergency Medicine

## 2019-04-01 DIAGNOSIS — Y92129 Unspecified place in nursing home as the place of occurrence of the external cause: Secondary | ICD-10-CM | POA: Insufficient documentation

## 2019-04-01 DIAGNOSIS — Y9301 Activity, walking, marching and hiking: Secondary | ICD-10-CM | POA: Insufficient documentation

## 2019-04-01 DIAGNOSIS — S52592A Other fractures of lower end of left radius, initial encounter for closed fracture: Secondary | ICD-10-CM

## 2019-04-01 DIAGNOSIS — S6992XA Unspecified injury of left wrist, hand and finger(s), initial encounter: Secondary | ICD-10-CM | POA: Diagnosis present

## 2019-04-01 DIAGNOSIS — Y998 Other external cause status: Secondary | ICD-10-CM | POA: Diagnosis not present

## 2019-04-01 DIAGNOSIS — F17200 Nicotine dependence, unspecified, uncomplicated: Secondary | ICD-10-CM | POA: Diagnosis not present

## 2019-04-01 DIAGNOSIS — W010XXA Fall on same level from slipping, tripping and stumbling without subsequent striking against object, initial encounter: Secondary | ICD-10-CM | POA: Diagnosis not present

## 2019-04-01 DIAGNOSIS — S42202A Unspecified fracture of upper end of left humerus, initial encounter for closed fracture: Secondary | ICD-10-CM | POA: Diagnosis not present

## 2019-04-01 DIAGNOSIS — Z79899 Other long term (current) drug therapy: Secondary | ICD-10-CM | POA: Diagnosis not present

## 2019-04-01 LAB — CBC WITH DIFFERENTIAL/PLATELET
Abs Immature Granulocytes: 0.07 10*3/uL (ref 0.00–0.07)
Basophils Absolute: 0.1 10*3/uL (ref 0.0–0.1)
Basophils Relative: 1 %
Eosinophils Absolute: 0.2 10*3/uL (ref 0.0–0.5)
Eosinophils Relative: 2 %
HCT: 38.1 % (ref 36.0–46.0)
Hemoglobin: 11.8 g/dL — ABNORMAL LOW (ref 12.0–15.0)
Immature Granulocytes: 1 %
Lymphocytes Relative: 24 %
Lymphs Abs: 2 10*3/uL (ref 0.7–4.0)
MCH: 28.4 pg (ref 26.0–34.0)
MCHC: 31 g/dL (ref 30.0–36.0)
MCV: 91.6 fL (ref 80.0–100.0)
Monocytes Absolute: 0.6 10*3/uL (ref 0.1–1.0)
Monocytes Relative: 7 %
Neutro Abs: 5.5 10*3/uL (ref 1.7–7.7)
Neutrophils Relative %: 65 %
Platelets: 181 10*3/uL (ref 150–400)
RBC: 4.16 MIL/uL (ref 3.87–5.11)
RDW: 16.2 % — ABNORMAL HIGH (ref 11.5–15.5)
WBC: 8.5 10*3/uL (ref 4.0–10.5)
nRBC: 0 % (ref 0.0–0.2)

## 2019-04-01 LAB — BASIC METABOLIC PANEL
Anion gap: 8 (ref 5–15)
BUN: 15 mg/dL (ref 8–23)
CO2: 25 mmol/L (ref 22–32)
Calcium: 8.2 mg/dL — ABNORMAL LOW (ref 8.9–10.3)
Chloride: 106 mmol/L (ref 98–111)
Creatinine, Ser: 0.56 mg/dL (ref 0.44–1.00)
GFR calc Af Amer: >60 mL/min (ref >60)
GFR calc non Af Amer: >60 mL/min (ref >60)
Glucose, Bld: 105 mg/dL — ABNORMAL HIGH (ref 70–99)
Potassium: 4 mmol/L (ref 3.5–5.1)
Sodium: 139 mmol/L (ref 135–145)

## 2019-04-01 LAB — TYPE AND SCREEN
ABO/RH(D): O NEG
Antibody Screen: NEGATIVE

## 2019-04-01 MED ORDER — HYDROMORPHONE HCL 1 MG/ML IJ SOLN
1.0000 mg | Freq: Once | INTRAMUSCULAR | Status: AC
  Administered 2019-04-01: 1 mg via INTRAVENOUS
  Filled 2019-04-01: qty 1

## 2019-04-01 MED ORDER — OXYCODONE HCL 5 MG PO TABS: 5.0000 mg | ORAL_TABLET | Freq: Four times a day (QID) | ORAL | 0 refills | Status: DC | PRN

## 2019-04-01 MED ORDER — SODIUM CHLORIDE 0.9 % IV SOLN
INTRAVENOUS | Status: DC
  Administered 2019-04-01: 18:00:00 via INTRAVENOUS

## 2019-04-01 MED ORDER — OXYCODONE-ACETAMINOPHEN 5-325 MG PO TABS
2.0000 | ORAL_TABLET | Freq: Once | ORAL | Status: AC
  Administered 2019-04-01: 2 via ORAL
  Filled 2019-04-01: qty 2

## 2019-04-01 NOTE — ED Triage Notes (Signed)
Pt brought in by EMS s./p a fall after using the bathroom.  Pt reports she lost her footing and fell.  Denies any LOC at this time.

## 2019-04-01 NOTE — ED Notes (Signed)
Pt transported to xray 

## 2019-04-01 NOTE — Discharge Instructions (Signed)
Your xray shows that you have a fracture of your upper arm at the shoulder. Your xray shows that you have a wrist fracture -  Please wear the splint and the sling until you follow up with the orthopedic doctors. You may need to have surgery on your forearm. One Oxycodone tablet every 6 hours as needed for pain You may need to take a stool softener if you use this because it can cause constipation Ice and elevate the wrist as much as possible. ER for severe or worsening pain / numbness or weakness of the arm or hand.

## 2019-04-01 NOTE — ED Notes (Signed)
5th on the list for [facility]

## 2019-04-01 NOTE — ED Provider Notes (Signed)
[facility] Provider Note   CSN: [id number] Arrival date & time: 04/01/19  1644     History   Chief Complaint Chief Complaint  Patient presents with  . Fall    HPI Catherine Wallace is a 68 y.o. female.     HPI  Images to me 68 year old female [facility], she evidently was walking with her walker when she tripped over backwards landing on her left side striking her forearm wrist and shoulder.  She had acute onset of pain when this happened, she had a nurse with her at the bedside who called for ambulance transport.  She has obvious mild deformity and does not want to move the arm secondary to pain, denies head injury neck pain chest pain abdominal pain or leg pain.  Paramedics report unremarkable vital signs prehospital.  No past medical history on file.  Patient Active Problem List   Diagnosis Date Noted  . Acute renal failure (ARF) (HCC) 03/04/2019  . AKI (acute kidney injury) (HCC)   . Fall   . Localized skin desquamation   . Rash     No past surgical history on file.   OB History   No obstetric history on file.      Home Medications    Prior to Admission medications   Medication Sig Start Date End Date Taking? Authorizing Provider  acetaminophen (TYLENOL) 325 MG tablet Take 650 mg by mouth every 6 (six) hours as needed for fever.    [provider]  carboxymethylcellulose (REFRESH PLUS) 0.5 % SOLN Place 2 drops into both eyes 3 (three) times daily.     [provider]  cephALEXin (KEFLEX) 250 MG capsule Take 250 mg by mouth 4 (four) times daily.    [provider]  Cholecalciferol (VITAMIN D) 50 MCG (2000 UT) tablet Take 2,000 Units by mouth daily.    [provider]  diphenhydrAMINE (BENADRYL) 25 mg capsule Take 25 mg by mouth every 6 (six) hours as needed for itching.    [provider]  HYDROcodone-acetaminophen (NORCO/VICODIN) 5-325 MG tablet Take 1  tablet by mouth every 6 (six) hours as needed for moderate pain. 03/09/19   Glade LloydAlekh, Kshitiz, MD  Hydrocortisone (GERHARDT'S BUTT CREAM) CREA Apply 1 application topically 3 (three) times daily. Cleanse buttocks and thigh areas that are red and "blistered" with the No Rinse Spray Cleanser in clean utility.  Gently pat dry. Apply liberal coats of Gerhardt's butt cream to affected sites 03/09/19   Glade LloydAlekh, Kshitiz, MD  levothyroxine (SYNTHROID) 25 MCG tablet Take 25 mcg by mouth daily before breakfast.    [provider]  morphine (MS CONTIN) 15 MG 12 hr tablet Take 15 mg by mouth every 12 (twelve) hours.    [provider]  oxybutynin (DITROPAN-XL) 5 MG 24 hr tablet Take 5 mg by mouth daily. 12/11/18   [provider]  oxyCODONE (ROXICODONE) 5 MG immediate release tablet Take 1 tablet (5 mg total) by mouth every 6 (six) hours as needed for severe pain. 04/01/19   Eber HongMiller, Hiroto Saltzman, MD  senna (SENOKOT) 8.6 MG tablet Take 1 tablet (8.6 mg total) by mouth 2 (two) times daily. 03/09/19   Glade LloydAlekh, Kshitiz, MD    Family History No family history on file.  Social History Social History   Tobacco Use  . Smoking status: Current Every Day Smoker  . Smokeless tobacco: Never Used  Substance Use Topics  . Alcohol use: Not on file  .  Drug use: Not on file     Allergies   Tape, Penicillins, and Sulfamethoxazole-trimethoprim   Review of Systems Review of Systems  All other systems reviewed and are negative.    Physical Exam Updated Vital Signs BP (!) 116/59   Pulse (!) 102   Temp 98.6 F (37 C) (Oral)   Resp 18   Ht 1.448 m (4\' 9" )   Wt 54.4 kg   SpO2 94%   BMI 25.97 kg/m   Physical Exam Vitals signs and nursing note reviewed.  Constitutional:      General: She is not in acute distress.    Appearance: She is well-developed.  HENT:     Head: Normocephalic and atraumatic.     Comments: Atraumatic head    Mouth/Throat:     Pharynx: No oropharyngeal exudate.  Eyes:      General: No scleral icterus.       Right eye: No discharge.        Left eye: No discharge.     Conjunctiva/sclera: Conjunctivae normal.     Pupils: Pupils are equal, round, and reactive to light.  Neck:     Musculoskeletal: Normal range of motion and neck supple.     Thyroid: No thyromegaly.     Vascular: No JVD.  Cardiovascular:     Rate and Rhythm: Normal rate and regular rhythm.     Heart sounds: Normal heart sounds. No murmur. No friction rub. No gallop.   Pulmonary:     Effort: Pulmonary effort is normal. No respiratory distress.     Breath sounds: Normal breath sounds. No wheezing or rales.  Abdominal:     General: Bowel sounds are normal. There is no distension.     Palpations: Abdomen is soft. There is no mass.     Tenderness: There is no abdominal tenderness.  Musculoskeletal: Normal range of motion.        General: Swelling, tenderness and deformity present.     Comments: Pain and swelling of the left humerus and distal forearm on the left, all other extremities are normal, no tenderness over the chest  Lymphadenopathy:     Cervical: No cervical adenopathy.  Skin:    General: Skin is warm and dry.     Findings: No erythema or rash.  Neurological:     Mental Status: She is alert.     Coordination: Coordination normal.     Comments: Normal sensation distal to the left wrist, pain with grip secondary to deformity at the wrist  Psychiatric:        Behavior: Behavior normal.      ED Treatments / Results  Labs (all labs ordered are listed, but only abnormal results are displayed) Labs Reviewed  CBC WITH DIFFERENTIAL/PLATELET - Abnormal; Notable for the following components:      Result Value   Hemoglobin 11.8 (*)    RDW 16.2 (*)    All other components within normal limits  BASIC METABOLIC PANEL  TYPE AND SCREEN    EKG None  Radiology Dg Elbow Complete Left  Result Date: 04/01/2019 CLINICAL DATA:  Fall EXAM: LEFT ELBOW - COMPLETE 3+ VIEW COMPARISON:   None. FINDINGS: There is no evidence of fracture, dislocation, or joint effusion. There is no evidence of arthropathy or other focal bone abnormality. Soft tissues are unremarkable. IMPRESSION: Negative. Electronically Signed   By: Rolm Baptise M.D.   On: 04/01/2019 19:34   Dg Forearm Left  Result Date: 04/01/2019 CLINICAL DATA:  Fall EXAM:  LEFT FOREARM - 2 VIEW COMPARISON:  None. FINDINGS: There is a distal left radial fracture. Mild posterior angulation and displacement of fracture fragments. Minimally displaced ulnar styloid fracture noted. No subluxation or dislocation. IMPRESSION: Displaced and angulated distal left radial fracture. Ulnar styloid fracture. Electronically Signed   By: Charlett Nose M.D.   On: 04/01/2019 19:33   Dg Wrist Complete Left  Result Date: 04/01/2019 CLINICAL DATA:  Fall EXAM: LEFT WRIST - COMPLETE 3+ VIEW COMPARISON:  Forearm series today FINDINGS: There is a distal left radial fracture. Posterior fragments are displaced and angulated posteriorly. No subluxation or dislocation. Minimally displaced ulnar styloid fracture. IMPRESSION: Displaced and angulated distal left radial fracture. Ulnar styloid fracture Electronically Signed   By: Charlett Nose M.D.   On: 04/01/2019 19:33   Dg Shoulder Left  Result Date: 04/01/2019 CLINICAL DATA:  Fall. EXAM: LEFT SHOULDER - 2+ VIEW COMPARISON:  Humerus series performed today FINDINGS: There is a proximal left humeral fracture involving the humeral neck and humeral head. Mild impaction at the humeral head. There is inferior subluxation. No frank dislocation. Soft tissues are intact. IMPRESSION: Impacted left humeral head/neck fracture. Inferior subluxation of the humeral head. Electronically Signed   By: Charlett Nose M.D.   On: 04/01/2019 19:36   Dg Humerus Left  Result Date: 04/01/2019 CLINICAL DATA:  Fall EXAM: LEFT HUMERUS - 2+ VIEW COMPARISON:  None. FINDINGS: There is a left humeral neck fracture noted. Inferior subluxation of the  humeral head. Soft tissues are intact. No additional acute bony abnormality. IMPRESSION: Left humeral neck fracture. Inferior subluxation of the humeral head. Electronically Signed   By: Charlett Nose M.D.   On: 04/01/2019 19:35    Procedures Procedures (including critical care time)  Medications Ordered in ED Medications  0.9 %  sodium chloride infusion ( Intravenous New Bag/Given 04/01/19 1806)  HYDROmorphone (DILAUDID) injection 1 mg (1 mg Intravenous Given 04/01/19 1804)  oxyCODONE-acetaminophen (PERCOCET/ROXICET) 5-325 MG per tablet 2 tablet (2 tablets Oral Given 04/01/19 2038)     Initial Impression / Assessment and Plan / ED Course  I have reviewed the triage vital signs and the nursing notes.  Pertinent labs & imaging results that were available during my care of the patient were reviewed by me and considered in my medical decision making (see chart for details).  Clinical Course as of Mar 31 2129  Wed Apr 01, 2019  2015 I discussed the care with Dr. Izora Ribas at 8:15 PM, he is willing to see the patient in follow-up and agrees with a forearm splint, sling and follow-up.   [BM]  2015 [employer] to place splint   [BM]    Clinical Course User Index [BM] Eber Hong, MD       Pulses intact at bilateral radial arteries, normal capillary refill, mild deformity, suspect distal radial fracture possible humeral fracture, patient does not have any signs of head injury or other trauma.  Imaging pending, pain medication given.  The patient has been given pain medication updated on her findings, the patient is stable for discharge to follow-up in the outpatient setting, at this time the patient has had all of her questions answered and is comfortable with the plan.  Reevaluated after splint placement, appears well, normal neurovascular status  Final Clinical Impressions(s) / ED Diagnoses   Final diagnoses:  Other closed fracture of distal end of left radius, initial encounter  Closed  fracture of proximal end of left humerus, unspecified fracture morphology, initial encounter  ED Discharge Orders         Ordered    oxyCODONE (ROXICODONE) 5 MG immediate release tablet  Every 6 hours PRN     04/01/19 2126           Eber Hong, MD 04/02/19 1214

## 2019-04-02 NOTE — ED Notes (Signed)
ptar at bedside to transport patient, all paperwork given to ptar.

## 2019-04-07 ENCOUNTER — Encounter (HOSPITAL_BASED_OUTPATIENT_CLINIC_OR_DEPARTMENT_OTHER): Payer: Medicare Other | Admitting: Internal Medicine

## 2019-04-07 ENCOUNTER — Other Ambulatory Visit: Payer: Self-pay

## 2019-04-07 DIAGNOSIS — R234 Changes in skin texture: Secondary | ICD-10-CM | POA: Diagnosis not present

## 2019-04-21 ENCOUNTER — Encounter (HOSPITAL_BASED_OUTPATIENT_CLINIC_OR_DEPARTMENT_OTHER): Payer: Medicare Other | Admitting: Physician Assistant

## 2019-04-21 ENCOUNTER — Other Ambulatory Visit: Payer: Self-pay

## 2019-04-21 DIAGNOSIS — R234 Changes in skin texture: Secondary | ICD-10-CM | POA: Diagnosis not present

## 2019-05-05 ENCOUNTER — Encounter (HOSPITAL_BASED_OUTPATIENT_CLINIC_OR_DEPARTMENT_OTHER): Payer: Medicare Other | Admitting: Internal Medicine

## 2019-05-05 NOTE — Progress Notes (Signed)
Catherine Wallace ([id number]) Visit Report for 04/07/2019 Arrival Information Details Patient Name: Date of Service: Catherine Wallace, BRZOSKA 04/07/2019 11:00 AM Medical Record [id number] Patient Account Number: [id number] Date of Birth/Sex: Treating RN: 1950/11/14 (68 y.o. Wynelle Link Primary Care Bless Belshe: Feliciana Rossetti Other Clinician: Referring Zahrah Sutherlin: Treating Rosario Kushner/Extender:Robson, Sheron Nightingale, Ronette Deter in Treatment: 1 Visit Information History Since Last Visit Added or deleted any medications: No Patient Arrived: Wheel Chair Any new allergies or adverse reactions: No Arrival Time: 11:58 Had a fall or experienced change in Yes activities of daily living that may affect Accompanied By: driver risk of falls: Transfer Assistance: None Signs or symptoms of abuse/neglect since last No Patient Identification Verified: Yes visito Secondary Verification Process Completed: Yes Hospitalized since last visit: No Patient Requires Transmission-Based No Implantable device outside of the clinic excluding No Precautions: cellular tissue based products placed in the center Patient Has Alerts: No since last visit: Has Dressing in Place as Prescribed: Yes Pain Present Now: Yes Notes had a fall 3 days ago in the bathroom and broke her shoulder she is in a lot of pain. Electronic Signature(s) Signed: 04/13/2019 5:59:11 PM By: Zandra Abts RN, BSN Entered By: Zandra Abts on 04/07/2019 11:59:37 -------------------------------------------------------------------------------- Compression Therapy Details Patient Name: Date of Service: Catherine Wallace 04/07/2019 11:00 AM Medical Record [id number] Patient Account Number: [id number] Date of Birth/Sex: Treating RN: 19-Jun-1950 (68 y.o. Freddy Finner Primary Care Roman Dubuc: Feliciana Rossetti Other Clinician: Referring Cyani Kallstrom: Treating Carley Glendenning/Extender:Robson, Sheron Nightingale, Ronette Deter in Treatment: 1 Compression Therapy Performed for  Wound Wound #2 Left,Dorsal Foot Assessment: Performed By: Clinician Yevonne Pax, RN Compression Type: Three Layer Post Procedure Diagnosis Same as Pre-procedure Electronic Signature(s) Signed: 05/05/2019 2:53:59 PM By: Yevonne Pax RN Entered By: Yevonne Pax on 04/07/2019 12:50:12 -------------------------------------------------------------------------------- Compression Therapy Details Patient Name: Date of Service: Catherine Wallace 04/07/2019 11:00 AM Medical Record [id number] Patient Account Number: [id number] Date of Birth/Sex: Treating RN: 10-Oct-1950 (68 y.o. Freddy Finner Primary Care Jason Frisbee: Feliciana Rossetti Other Clinician: Referring Hani Patnode: Treating Melik Blancett/Extender:Robson, Sheron Nightingale, Ronette Deter in Treatment: 1 Compression Therapy Performed for Wound Wound #3 Left,Posterior Lower Leg Assessment: Performed By: Clinician Yevonne Pax, RN Compression Type: Three Layer Post Procedure Diagnosis Same as Pre-procedure Electronic Signature(s) Signed: 05/05/2019 2:53:59 PM By: Yevonne Pax RN Entered By: Yevonne Pax on 04/07/2019 12:50:13 -------------------------------------------------------------------------------- Compression Therapy Details Patient Name: Date of Service: Catherine Wallace 04/07/2019 11:00 AM Medical Record [id number] Patient Account Number: [id number] Date of Birth/Sex: Treating RN: July 27, 1950 (68 y.o. Freddy Finner Primary Care Gael Londo: Feliciana Rossetti Other Clinician: Referring Breandan People: Treating Alessandria Henken/Extender:Robson, Sheron Nightingale, Ronette Deter in Treatment: 1 Compression Therapy Performed for Wound Wound #4 Left,Distal,Posterior Lower Leg Assessment: Performed By: Little Ishikawa, RN Compression Type: Three Layer Post Procedure Diagnosis Same as Pre-procedure Electronic Signature(s) Signed: 05/05/2019 2:53:59 PM By: Yevonne Pax RN Entered By: Yevonne Pax on 04/07/2019  12:50:13 -------------------------------------------------------------------------------- Compression Therapy Details Patient Name: Date of Service: Catherine Wallace 04/07/2019 11:00 AM Medical Record [id number] Patient Account Number: [id number] Date of Birth/Sex: Treating RN: May 29, 1950 (68 y.o. Freddy Finner Primary Care Dalma Panchal: Feliciana Rossetti Other Clinician: Referring Yulieth Carrender: Treating Biridiana Twardowski/Extender:Robson, Sheron Nightingale, Ronette Deter in Treatment: 1 Compression Therapy Performed for Wound Wound #5 Right Gluteus Assessment: Performed By: Clinician Yevonne Pax, RN Compression Type: Three Layer Post Procedure Diagnosis Same as Pre-procedure Electronic Signature(s) Signed: 05/05/2019 2:53:59 PM By: Yevonne Pax RN Entered By: Yevonne Pax on 04/07/2019 12:50:13 -------------------------------------------------------------------------------- Encounter Discharge Information Details Patient Name: Date of Service: Catherine Wallace 04/07/2019 11:00 AM Medical Record  [id number] Patient Account Number: [id number] Date of Birth/Sex: Treating RN: 1950-12-25 (68 y.o. Harvest Dark Primary Care Annessa Satre: Feliciana Rossetti Other Clinician: Referring Vernecia Umble: Treating Carron Jaggi/Extender:Robson, Sheron Nightingale, Ronette Deter in Treatment: 1 Encounter Discharge Information Items Discharge Condition: Stable Ambulatory Status: Wheelchair Discharge Destination: Home Transportation: Other Accompanied By: caregiver Schedule Follow-up Appointment: Yes Clinical Summary of Care: Patient Declined Electronic Signature(s) Signed: 04/10/2019 5:20:59 PM By: Cherylin Mylar Entered By: Cherylin Mylar on 04/07/2019 17:42:37 -------------------------------------------------------------------------------- Lower Extremity Assessment Details Patient Name: Date of Service: Catherine Wallace 04/07/2019 11:00 AM Medical Record [id number] Patient Account Number: [id number] Date of Birth/Sex:  Treating RN: [birth year] (68 y.o. Tommye Standard Primary Care Marquita Lias: Feliciana Rossetti Other Clinician: Referring Jnya Brossard: Treating Meri Pelot/Extender:Robson, Sheron Nightingale, Ronette Deter in Treatment: 1 Edema Assessment Assessed: [Left: No] [Right: No] Edema: [Left: Ye] [Right: s] Calf Left: Right: Point of Measurement: cm From Medial Instep 35.5 cm cm Ankle Left: Right: Point of Measurement: cm From Medial Instep 24.2 cm cm Vascular Assessment Pulses: Dorsalis Pedis Palpable: [Left:Yes] Electronic Signature(s) Signed: 04/07/2019 2:49:59 PM By: Zenaida Deed RN, BSN Entered By: Zenaida Deed on 04/07/2019 12:10:28 -------------------------------------------------------------------------------- Multi Wound Chart Details Patient Name: Date of Service: KANESHIA, CATER 04/07/2019 11:00 AM Medical Record [id number] Patient Account Number: [id number] Date of Birth/Sex: Treating RN: 23-Jan-1951 (68 y.o. Freddy Finner Primary Care Neilani Duffee: Feliciana Rossetti Other Clinician: Referring Muhammed Teutsch: Treating Cailey Trigueros/Extender:Robson, Sheron Nightingale, Ronette Deter in Treatment: 1 Vital Signs Height(in): 58 Pulse(bpm): 97 Weight(lbs): 120 Blood Pressure(mmHg): 121/78 Body Mass Index(BMI): 25 Temperature(F): 98.7 Respiratory 18 Rate(breaths/min): Photos: [1:No Photos] [2:No Photos] [3:No Photos] Wound Location: [1:Left Foot - Lateral] [2:Left Foot - Dorsal] [3:Left Lower Leg - Posterior] Wounding Event: [1:Gradually Appeared] [2:Gradually Appeared] [3:Gradually Appeared] Primary Etiology: [1:Lymphedema] [2:Lymphedema] [3:Lymphedema] Comorbid History: [1:Cataracts, Deep Vein Thrombosis, Peripheral Venous Disease, Osteoarthritis, Neuropathy Osteoarthritis, Neuropathy Osteoarthritis, Neuropathy] [2:Cataracts, Deep Vein Thrombosis, Peripheral Venous Disease,] [3:Cataracts, Deep Vein  Thrombosis, Peripheral Venous Disease,] Date Acquired: [1:03/28/2018] [2:03/28/2018] [3:03/28/2018] Weeks of  Treatment: [1:1] [2:1] [3:1] Wound Status: [1:Healed - Epithelialized] [2:Open] [3:Open] Measurements L x W x D 0x0x0 [2:0.5x2.6x0.1] [3:5x6.5x0.1] (cm) Area (cm) : [1:0] [2:1.021] [3:25.525] Volume (cm) : [1:0] [2:0.102] [3:2.553] % Reduction in Area: [1:100.00%] [2:-116.80%] [3:3.80%] % Reduction in Volume: 100.00% [2:-117.00%] [3:3.80%] Classification: [1:Full Thickness Without Exposed Support Structures Exposed Support Structures Exposed Support Structures] [2:Full Thickness Without] [3:Full Thickness Without] Exudate Amount: [1:None Present] [2:Small] [3:Medium] Exudate Type: [1:N/A] [2:Serosanguineous] [3:Serous] Exudate Color: [1:N/A] [2:red, brown] [3:amber] Wound Margin: [1:Flat and Intact] [2:Flat and Intact] [3:Flat and Intact] Granulation Amount: [1:None Present (0%)] [2:Large (67-100%)] [3:Medium (34-66%)] Granulation Quality: [1:N/A] [2:Red] [3:Red, Hyper-granulation] Necrotic Amount: [1:None Present (0%)] [2:None Present (0%)] [3:Medium (34-66%)] Necrotic Tissue: [1:N/A] [2:N/A] [3:Eschar] Exposed Structures: [1:Fascia: No Fat Layer (Subcutaneous Tissue) Exposed: Yes Tissue) Exposed: No Tendon: No Muscle: No Joint: No Bone: No] [2:Fat Layer (Subcutaneous Fat Layer (Subcutaneous Fascia: No Tendon: No Muscle: No Joint: No Bone: No] [3:Tissue) Exposed: Yes  Fascia: No Tendon: No Muscle: No Joint: No Bone: No] Epithelialization: [1:Large (67-100%)] [2:Medium (34-66%)] [3:Small (1-33%)] Assessment Notes: [1:N/A] [2:N/A] [3:N/A] Procedures Performed: N/A [2:Compression Therapy 4] [3:Compression Therapy 5] Photos: [2:No Photos] [3:No Photos N/A] Wound Location: [1:Left Lower Leg - Posterior, Distal] [2:Right Gluteus] [3:N/A] Wounding Event: [1:Gradually Appeared] [2:Pressure Injury] [3:N/A] Primary Etiology: [1:Lymphedema] [2:Pressure Ulcer] [3:N/A] Comorbid History: [1:Cataracts, Deep Vein Thrombosis, Peripheral Venous Disease, Osteoarthritis, Neuropathy] [2:Cataracts, Deep Vein  Thrombosis, Peripheral Venous Disease, Osteoarthritis, Neuropathy] [3:N/A] Date Acquired: [1:03/28/2018] [2:04/07/2019] [3:N/A] Weeks of Treatment: [1:1] [2:0] [3:N/A] Wound Status: [1:Open] [2:Open] [  3:N/A] Measurements L x W x D [1:0.5x1.5x0.1] [2:15x10x0.1] [3:N/A] (cm) Area (cm) : [1:0.589] [2:117.81] [3:N/A] Volume (cm) : [1:0.059] [2:11.781] [3:N/A] % Reduction in Area: [1:50.00%] [2:0.00%] [3:N/A] % Reduction in Volume: [1:50.00%] [2:0.00%] [3:N/A] Classification: [1:Full Thickness Without Exposed Support Structures] [2:Category/Stage II] [3:N/A] Exudate Amount: [1:Small] [2:Medium] [3:N/A] Exudate Type: [1:Serous] [2:Serosanguineous] [3:N/A] Exudate Color: [1:amber] [2:red, brown] [3:N/A] Wound Margin: [1:Flat and Intact] [2:Flat and Intact] [3:N/A] Granulation Amount: [1:Large (67-100%)] [2:Large (67-100%)] [3:N/A] Granulation Quality: [1:Red] [2:Red] [3:N/A] Necrotic Amount: [1:None Present (0%)] [2:None Present (0%)] [3:N/A] Necrotic Tissue: [1:N/A] [2:N/A] [3:N/A] Exposed Structures: [1:Fat Layer (Subcutaneous Fascia: No Tissue) Exposed: Yes Fascia: No Tendon: No Muscle: No Joint: No Bone: No] [2:Fat Layer (Subcutaneous Tissue) Exposed: No Tendon: No Muscle: No Joint: No Bone: No Limited to Skin Breakdown] [3:N/A] Epithelialization: [1:Medium (34-66%)] [2:Small (1-33%)] [3:N/A] Assessment Notes: [1:N/A Compression Therapy] [2:periwound excoriated, dry and flaky Compression Therapy] [3:N/A N/A] Treatment Notes Electronic Signature(s) Signed: 04/07/2019 5:17:49 PM By: Linton Ham MD Signed: 05/05/2019 2:53:59 PM By: Carlene Coria RN Entered By: Linton Ham on 04/07/2019 13:20:32 -------------------------------------------------------------------------------- Multi-Disciplinary Care Plan Details Patient Name: Date of Service: JHANIA, ETHERINGTON 04/07/2019 11:00 AM Medical Record [id number] Patient Account Number: [id number] Date of Birth/Sex: Treating RN: 1950-12-10  (68 y.o. Orvan Falconer Primary Care Cesareo Vickrey: Gilford Rile Other Clinician: Referring Afnan Emberton: Treating Lakera Viall/Extender:Robson, Josefina Do, [employer] in Treatment: 1 Active Inactive Wound/Skin Impairment Nursing Diagnoses: Knowledge deficit related to ulceration/compromised skin integrity Goals: Patient/caregiver will verbalize understanding of skin care regimen Date Initiated: 03/31/2019 Target Resolution Date: 04/24/2019 Goal Status: Active Ulcer/skin breakdown will have a volume reduction of 30% by week 4 Date Initiated: 03/31/2019 Target Resolution Date: 04/24/2019 Goal Status: Active Interventions: Assess patient/caregiver ability to obtain necessary supplies Assess patient/caregiver ability to perform ulcer/skin care regimen upon admission and as needed Assess ulceration(s) every visit Notes: Electronic Signature(s) Signed: 05/05/2019 2:53:59 PM By: Carlene Coria RN Entered By: Carlene Coria on 04/07/2019 11:16:03 -------------------------------------------------------------------------------- Pain Assessment Details Patient Name: Date of Service: KAMBREA, CARRASCO 04/07/2019 11:00 AM Medical Record [id number] Patient Account Number: [id number] Date of Birth/Sex: Treating RN: 1951-02-04 (68 y.o. Roselynne Fetter Primary Care Sienna Stonehocker: Gilford Rile Other Clinician: Referring Gini Caputo: Treating Janesha Brissette/Extender:Robson, Josefina Do, [employer] in Treatment: 1 Active Problems Location of Pain Severity and Description of Pain Patient Has Paino Yes Site Locations Rate the pain. Current Pain Level: 8 Pain Management and Medication Current Pain Management: Electronic Signature(s) Signed: 04/13/2019 5:59:11 PM By: Levan Hurst RN, BSN Entered By: Levan Hurst on 04/07/2019 12:00:25 -------------------------------------------------------------------------------- Patient/Caregiver Education Details Patient Name: Date of Service: Catherine Wallace, STOGNER  11/10/2020andnbsp11:00 AM Medical Record [phone] Patient Account Number: [id number] Date of Birth/Gender: March 31, 1951 (68 y.o. F) Treating RN: Carlene Coria Primary Care Physician: Gilford Rile Other Clinician: Referring Physician: Treating Physician/Extender:Robson, Josefina Do, [employer] in Treatment: 1 Education Assessment Education Provided To: Patient Education Topics Provided Wound/Skin Impairment: Methods: Explain/Verbal Responses: State content correctly Electronic Signature(s) Signed: 05/05/2019 2:53:59 PM By: Carlene Coria RN Entered By: Carlene Coria on 04/07/2019 11:16:37 -------------------------------------------------------------------------------- Wound Assessment Details Patient Name: Date of Service: Catherine Wallace, TASHIRO 04/07/2019 11:00 AM Medical Record [id number] Patient Account Number: [id number] Date of Birth/Sex: Treating RN: [birth year] (68 y.o. Sharonne Fetter Primary Care Johnna Bollier: Gilford Rile Other Clinician: Referring Fitz Matsuo: Treating Valynn Schamberger/Extender:Robson, Josefina Do, [employer] in Treatment: 1 Wound Status Wound Number: 1 Primary Lymphedema Etiology: Wound Location: Left Foot - Lateral Wound Healed - Epithelialized Wounding Event: Gradually Appeared Status: Date Acquired: 03/28/2018 Comorbid Cataracts, Deep Vein Thrombosis, Peripheral Weeks Of Treatment:  1 History: Venous Disease, Osteoarthritis, Neuropathy Clustered Wound: No Photos Wound Measurements Length: (cm) 0 % Redu Width: (cm) 0 % Redu Depth: (cm) 0 Epithe Area: (cm) 0 Tunne Volume: (cm) 0 Under Wound Description Full Thickness Without Exposed Support Foul O Classification: Structures Slough Wound Flat and Intact Margin: Exudate None Present Amount: Wound Bed Granulation Amount: None Present (0%) Necrotic Amount: None Present (0%) Fascia Fat La Tendon Muscle Joint Bone E dor After Cleansing: No /Fibrino No Exposed Structure Exposed: No yer  (Subcutaneous Tissue) Exposed: No Exposed: No Exposed: No Exposed: No xposed: No ction in Area: 100% ction in Volume: 100% lialization: Large (67-100%) ling: No mining: No Electronic Signature(s) Signed: 04/08/2019 4:27:20 PM By: Benjaman Kindler EMT/HBOT Signed: 04/13/2019 5:59:11 PM By: Zandra Abts RN, BSN Previous Signature: 04/07/2019 2:49:59 PM Version By: Zenaida Deed RN, BSN Entered By: Benjaman Kindler on 04/08/2019 08:38:27 -------------------------------------------------------------------------------- Wound Assessment Details Patient Name: Date of Service: KEELEIGH, TERRIS 04/07/2019 11:00 AM Medical Record [id number] Patient Account Number: [id number] Date of Birth/Sex: Treating RN: 1950/09/06 (68 y.o. Wynelle Link Primary Care Kamdyn Covel: Feliciana Rossetti Other Clinician: Referring Sible Straley: Treating My Madariaga/Extender:Robson, Sheron Nightingale, Ronette Deter in Treatment: 1 Wound Status Wound Number: 2 Primary Lymphedema Etiology: Wound Location: Left Foot - Dorsal Wound Open Wounding Event: Gradually Appeared Status: Date Acquired: 03/28/2018 Comorbid Cataracts, Deep Vein Thrombosis, Peripheral Weeks Of Treatment: 1 History: Venous Disease, Osteoarthritis, Neuropathy Clustered Wound: No Photos Wound Measurements Length: (cm) 0.5 % Reduct Width: (cm) 2.6 % Reduct Depth: (cm) 0.1 Epitheli Area: (cm) 1.021 Tunneli Volume: (cm) 0.102 Undermi Wound Description Classification: Full Thickness Without Exposed Support Foul Odo Structures Slough/F Wound Flat and Intact Margin: Exudate Small Amount: Exudate Serosanguineous Type: Exudate red, brown Color: Wound Bed Granulation Amount: Large (67-100%) Granulation Quality: Red Fascia E Necrotic Amount: None Present (0%) Fat Laye Tendon E Muscle E Joint Ex Bone Exp r After Cleansing: No ibrino No Exposed Structure xposed: No r (Subcutaneous Tissue) Exposed: Yes xposed: No xposed: No posed: No osed:  No ion in Area: -116.8% ion in Volume: -117% alization: Medium (34-66%) ng: No ning: No Electronic Signature(s) Signed: 04/08/2019 4:27:20 PM By: Benjaman Kindler EMT/HBOT Signed: 04/13/2019 5:59:11 PM By: Zandra Abts RN, BSN Previous Signature: 04/07/2019 2:49:59 PM Version By: Zenaida Deed RN, BSN Entered By: Benjaman Kindler on 04/08/2019 08:38:55 -------------------------------------------------------------------------------- Wound Assessment Details Patient Name: Date of Service: KENNAH, HEHR 04/07/2019 11:00 AM Medical Record [id number] Patient Account Number: [id number] Date of Birth/Sex: Treating RN: 08-May-1951 (68 y.o. Wynelle Link Primary Care Kwamane Whack: Feliciana Rossetti Other Clinician: Referring Kashia Brossard: Treating Raed Schalk/Extender:Robson, Sheron Nightingale, Ronette Deter in Treatment: 1 Wound Status Wound Number: 3 Primary Lymphedema Etiology: Wound Location: Left Lower Leg - Posterior Wound Open Wounding Event: Gradually Appeared Status: Date Acquired: 03/28/2018 Comorbid Cataracts, Deep Vein Thrombosis, Peripheral Weeks Of Treatment: 1 History: Venous Disease, Osteoarthritis, Neuropathy Clustered Wound: No Photos Wound Measurements Length: (cm) 5 % Redu Width: (cm) 6.5 % Redu Depth: (cm) 0.1 Epithe Area: (cm) 25.525 Tunne Volume: (cm) 2.553 Under Wound Description Classification: Full Thickness Without Exposed Support Foul O Structures Slough Wound Flat and Intact Margin: Exudate Medium Amount: Exudate Serous Type: Exudate amber Color: Wound Bed Granulation Amount: Medium (34-66%) Granulation Quality: Red, Hyper-granulation Fascia Necrotic Amount: Medium (34-66%) Fat La Necrotic Quality: Eschar Tendon Muscle Joint Bone E dor After Cleansing: No /Fibrino No Exposed Structure Exposed: No yer (Subcutaneous Tissue) Exposed: Yes Exposed: No Exposed: No Exposed: No xposed: No ction in Area: 3.8% ction in Volume: 3.8% lialization:  Small (  1-33%) ling: No mining: No Electronic Signature(s) Signed: 04/08/2019 4:27:20 PM By: Benjaman Kindler EMT/HBOT Signed: 04/13/2019 5:59:11 PM By: Zandra Abts RN, BSN Previous Signature: 04/07/2019 2:49:59 PM Version By: Zenaida Deed RN, BSN Entered By: Benjaman Kindler on 04/08/2019 08:39:36 -------------------------------------------------------------------------------- Wound Assessment Details Patient Name: Date of Service: RYLA, CAUTHON 04/07/2019 11:00 AM Medical Record [id number] Patient Account Number: [id number] Date of Birth/Sex: Treating RN: 03-23-1951 (68 y.o. Wynelle Link Primary Care Keyaira Clapham: Feliciana Rossetti Other Clinician: Referring Kasen Adduci: Treating Pier Bosher/Extender:Robson, Sheron Nightingale, Ronette Deter in Treatment: 1 Wound Status Wound Number: 4 Primary Lymphedema Etiology: Wound Location: Left Lower Leg - Posterior, Distal Wound Open Wounding Event: Gradually Appeared Status: Date Acquired: 03/28/2018 Comorbid Cataracts, Deep Vein Thrombosis, Peripheral Weeks Of Treatment: 1 History: Venous Disease, Osteoarthritis, Neuropathy Clustered Wound: No Photos Wound Measurements Length: (cm) 0.5 % Redu Width: (cm) 1.5 % Redu Depth: (cm) 0.1 Epithe Area: (cm) 0.589 Tunne Volume: (cm) 0.059 Under Wound Description Classification: Full Thickness Without Exposed Support Foul O Structures Slough Wound Flat and Intact Margin: Exudate Small Amount: Exudate Serous Type: Exudate amber Color: Wound Bed Granulation Amount: Large (67-100%) Granulation Quality: Red Fascia Necrotic Amount: None Present (0%) Fat La Tendon Ex Muscle Ex Joint Exp Bone Expo dor After Cleansing: No /Fibrino No Exposed Structure Exposed: No yer (Subcutaneous Tissue) Exposed: Yes posed: No posed: No osed: No sed: No ction in Area: 50% ction in Volume: 50% lialization: Medium (34-66%) ling: No mining: No Electronic Signature(s) Signed: 04/08/2019 4:27:20 PM  By: Benjaman Kindler EMT/HBOT Signed: 04/13/2019 5:59:11 PM By: Zandra Abts RN, BSN Previous Signature: 04/07/2019 2:49:59 PM Version By: Zenaida Deed RN, BSN Entered By: Benjaman Kindler on 04/08/2019 08:39:58 -------------------------------------------------------------------------------- Wound Assessment Details Patient Name: Date of Service: NICOLE, DEFINO 04/07/2019 11:00 AM Medical Record [id number] Patient Account Number: [id number] Date of Birth/Sex: Treating RN: 26-Jan-1951 (68 y.o. Wynelle Link Primary Care Tatym Schermer: Feliciana Rossetti Other Clinician: Referring Davona Kinoshita: Treating Sarika Baldini/Extender:Robson, Sheron Nightingale, Ronette Deter in Treatment: 1 Wound Status Wound Number: 5 Primary Pressure Ulcer Etiology: Wound Location: Right Gluteus Wound Open Wounding Event: Pressure Injury Status: Date Acquired: 04/07/2019 Comorbid Cataracts, Deep Vein Thrombosis, Peripheral Weeks Of Treatment: 0 History: Venous Disease, Osteoarthritis, Neuropathy Clustered Wound: No Photos Wound Measurements Length: (cm) 15 Width: (cm) 10 Depth: (cm) 0.1 Area: (cm) 117.81 Volume: (cm) 11.781 Wound Description Classification: Category/Stage II Wound Margin: Flat and Intact Exudate Amount: Medium Exudate Type: Serosanguineous Exudate Color: red, brown Wound Bed Granulation Amount: Large (67-100%) Granulation Quality: Red Necrotic Amount: None Present (0%) After Cleansing: No brino No Exposed Structure posed: No (Subcutaneous Tissue) Exposed: No posed: No posed: No osed: No sed: No o Skin Breakdown % Reduction in Area: 0% % Reduction in Volume: 0% Epithelialization: Small (1-33%) Tunneling: No Undermining: No Foul Odor Slough/Fi Fascia Ex Fat Layer Tendon Ex Muscle Ex Joint Exp Bone Expo Limited t Assessment Notes periwound excoriated, dry and flaky Electronic Signature(s) Signed: 04/08/2019 4:27:20 PM By: Benjaman Kindler EMT/HBOT Signed: 04/13/2019 5:59:11  PM By: Zandra Abts RN, BSN Previous Signature: 04/07/2019 2:49:59 PM Version By: Zenaida Deed RN, BSN Entered By: Benjaman Kindler on 04/08/2019 08:39:14 -------------------------------------------------------------------------------- Vitals Details Patient Name: Date of Service: Catherine Wallace, MARSCHKE 04/07/2019 11:00 AM Medical Record [id number] Patient Account Number: [id number] Date of Birth/Sex: Treating RN: 05/28/1951 (68 y.o. Wynelle Link Primary Care Traeson Dusza: Feliciana Rossetti Other Clinician: Referring Liona Wengert: Treating Margaretha Mahan/Extender:Robson, Sheron Nightingale, Ronette Deter in Treatment: 1 Vital Signs Time Taken: 11:59 Temperature (F): 98.7 Height (in): 58 Pulse (bpm): 97 Weight (lbs): 120  Respiratory Rate (breaths/min): 18 Body Mass Index (BMI): 25.1 Blood Pressure (mmHg): 121/78 Reference Range: 80 - 120 mg / dl Electronic Signature(s) Signed: 04/13/2019 5:59:11 PM By: Zandra AbtsLynch, Shatara RN, BSN Entered By: Zandra AbtsLynch, Shatara on 04/07/2019 11:59:58

## 2019-05-05 NOTE — Progress Notes (Signed)
ALYLAH, BLAKNEY ([id number]) Visit Report for 04/21/2019 Chief Complaint Document Details Patient Name: Date of Service: Catherine Wallace, Catherine Wallace 04/21/2019 11:00 AM Medical Record [id number] Patient Account Number: [id number] Date of Birth/Sex: Treating RN: 1951/04/16 (68 y.o. F) Primary Care Provider: Feliciana Rossetti Other Clinician: Referring Provider: Treating Provider/Extender:Stone III, Sandy Salaam, Ronette Deter in Treatment: 3 Information Obtained from: Patient Chief Complaint 03/31/2019; patient is here for review of wounds on her right lower leg and also asked to look at the skin in the buttocks right greater than left and groin area. Electronic Signature(s) Signed: 04/22/2019 4:52:33 PM By: Lenda Kelp PA-C Entered By: Lenda Kelp on 04/22/2019 16:49:49 -------------------------------------------------------------------------------- HPI Details Patient Name: Date of Service: Catherine Wallace, Catherine Wallace 04/21/2019 11:00 AM Medical Record [id number] Patient Account Number: [id number] Date of Birth/Sex: Treating RN: 12-26-1950 (68 y.o. F) Primary Care Provider: Feliciana Rossetti Other Clinician: Referring Provider: Treating Provider/Extender:Stone III, Sandy Salaam, Ronette Deter in Treatment: 3 History of Present Illness HPI Description: ADMISSION 03/31/2019 This is a 68 year old woman who currently is at [facility] skilled facility. She is here predominantly for review of wound on the left posterior calf. The history behind this wound is very difficult to obtain. She says she has had weeping fluid in this area on and off for a year. Then she told me that the wound had only been open for 2 to 3 weeks. On her left lower extremity she has a fairly significant area on the left posterior calf 2 different areas including a major area superiorly and a smaller 1 just above the Achilles area. She has an area on the left lateral foot and the left dorsal foot which are small benign looking wounds. It  looks as though they have been applying Xeroform. Most of her recent records in [place] [facility] seem to have been involving a perineal rash. She has been in and out of hospital for left lower extremity cellulitis apparently in the area of the week quoted above she was actually sent to [facility] burn center and was admitted from 9/23 soon 9/28 where the area itself was biopsied and this was felt to be a symmetrical drug-related intertriginous and flexural exanthema. I think this is a description rather than the diagnosis. I think the culprit medication was felt to be Bactrim. There was felt at this time to be lesions on the face, oral mucosa and genitalia [o Stevens-Johnson syndrome]. In any case she was hospitalized again from 10/7 through 10/13. She was found to have acute renal failure with an elevated CK. Noted to have recurrent falls. While in the hospital she was seen by the wound ostomy nurses who felt she had a scolding skin and they were using Aquacel, Gerhart's Butt cream. She was back in the ER on 10/20 Past medical history; recurrent cellulitis of the legs, PAD, OA of the knees, idiopathic peripheral neuropathy, intertriginous dermatitis, venous insufficiency with a history of ablations in the right leg apparently in 2018 but I have not seen this information, lymphedema. Remote right leg DVT per the patient We could not obtain ABIs on the left leg because of pain. Apparently there is arterial studies quoted in 2018 however this is on care everywhere and we could not open this. 11/10. We have the patient's left leg and foot and Hydrofera Blue under compression. She appears to be tolerating this 2 wounds on the foot and the more major wound on the left posterior calf. The area of absolutely terrible rash involving her buttocks right greater than  left extending into the posterior thigh is still weeping angry and painful some of the angry erythema seems better at the margins and I wonder if we  are making some progress. Since the patient was last here she broke her left arm I could barely get her over enough to see this properly 04/21/2019 on evaluation today patient appears to be doing well with regard to the wounds at this point. She unfortunately is not doing a whole lot better but at least things do not seem to be getting significantly worse at this time there is still a lot of excoriation and skin breakdown unfortunately. I do feel like that the gluteal area is actually improving but again the yeast infection is not completely resolved in this regard. Overall she is making progress but not quite where we want her to be at this point. Electronic Signature(s) Signed: 04/22/2019 4:52:33 PM By: Lenda Kelp PA-C Entered By: Lenda Kelp on 04/22/2019 16:50:03 -------------------------------------------------------------------------------- Physical Exam Details Patient Name: Date of Service: Catherine Wallace, Catherine Wallace 04/21/2019 11:00 AM Medical Record [id number] Patient Account Number: [id number] Date of Birth/Sex: Treating RN: 20-Aug-1950 (68 y.o. F) Primary Care Provider: Feliciana Rossetti Other Clinician: Referring Provider: Treating Provider/Extender:Stone III, Sandy Salaam, Ronette Deter in Treatment: 3 Constitutional Well-nourished and well-hydrated in no acute distress. Respiratory normal breathing without difficulty. clear to auscultation bilaterally. Cardiovascular regular rate and rhythm with normal S1, S2. Psychiatric this patient is able to make decisions and demonstrates good insight into disease process. Alert and Oriented x 3. pleasant and cooperative. Notes Upon inspection today patient's wounds again are shown signs of slight improvement especially in regard to the skin irritation in the gluteal region. With that being said she still has open wounds as well on the dorsal foot left posterior lower leg and these areas though not really measuring significantly larger  also not really significantly smaller at this time. Electronic Signature(s) Signed: 04/22/2019 4:52:33 PM By: Lenda Kelp PA-C Entered By: Lenda Kelp on 04/22/2019 16:50:46 -------------------------------------------------------------------------------- Physician Orders Details Patient Name: Date of Service: Catherine Wallace, Catherine Wallace 04/21/2019 11:00 AM Medical Record [id number] Patient Account Number: [id number] Date of Birth/Sex: Treating RN: May 15, 1951 (68 y.o. Freddy Finner Primary Care Provider: Feliciana Rossetti Other Clinician: Referring Provider: Treating Provider/Extender:Stone III, Sandy Salaam, Ronette Deter in Treatment: 3 Verbal / Phone Orders: No Diagnosis Coding ICD-10 Coding Code Description [phone] Non-pressure chronic ulcer of left calf limited to breakdown of skin L97.521 Non-pressure chronic ulcer of other part of left foot limited to breakdown of skin I87.331 Chronic venous hypertension (idiopathic) with ulcer and inflammation of right lower extremity I89.0 Lymphedema, not elsewhere classified B37.2 Candidiasis of skin and nail Follow-up Appointments Return Appointment in 2 weeks. Dressing Change Frequency Wound #2 Left,Dorsal Foot Change Dressing every other day. Wound #3 Left,Posterior Lower Leg Change Dressing every other day. Wound #4 Left,Distal,Posterior Lower Leg Change Dressing every other day. Wound #5 Right Gluteus Other: - 3 times per day Skin Barriers/Peri-Wound Care Wound #2 Left,Dorsal Foot Barrier cream TCA Cream or Ointment - liberally Wound #3 Left,Posterior Lower Leg Barrier cream TCA Cream or Ointment - liberally Wound #4 Left,Distal,Posterior Lower Leg Barrier cream TCA Cream or Ointment - liberally Wound Cleansing Wound #2 Left,Dorsal Foot Clean wound with Normal Saline. Wound #3 Left,Posterior Lower Leg Clean wound with Normal Saline. Wound #4 Left,Distal,Posterior Lower Leg Clean wound with Normal Saline. Wound #5 Right  Gluteus Clean wound with Normal Saline. Primary Wound Dressing Wound #2 Left,Dorsal Foot Hydrofera Blue - apply adaptic  prior to application of hydrofera blue CLASSIC, moisten with normal saline Wound #3 Left,Posterior Lower Leg Hydrofera Blue - apply adaptic prior to application of hydrofera blue CLASSIC, moisten with normal saline Wound #4 Left,Distal,Posterior Lower Leg Hydrofera Blue - apply adaptic prior to application of hydrofera blue CLASSIC, moisten with normal saline Wound #5 Right Gluteus Other: - lotrison cream Secondary Dressing Wound #2 Left,Dorsal Foot ABD pad Wound #3 Left,Posterior Lower Leg ABD pad Wound #4 Left,Distal,Posterior Lower Leg ABD pad Edema Control Wound #2 Left,Dorsal Foot 3 Layer Compression System - Left Lower Extremity Wound #3 Left,Posterior Lower Leg 3 Layer Compression System - Left Lower Extremity Wound #4 Left,Distal,Posterior Lower Leg 3 Layer Compression System - Left Lower Extremity Electronic Signature(s) Signed: 04/21/2019 10:29:27 PM By: Lenda Kelp PA-C Signed: 05/05/2019 2:55:30 PM By: Yevonne Pax RN Entered By: Yevonne Pax on 04/21/2019 11:50:11 -------------------------------------------------------------------------------- Problem List Details Patient Name: Date of Service: Catherine Wallace, Catherine Wallace 04/21/2019 11:00 AM Medical Record [id number] Patient Account Number: [id number] Date of Birth/Sex: Treating RN: 05-30-1950 (68 y.o. Freddy Finner Primary Care Provider: Feliciana Rossetti Other Clinician: Referring Provider: Treating Provider/Extender:Stone III, Sandy Salaam, Ronette Deter in Treatment: 3 Active Problems ICD-10 Evaluated Encounter Code Description Active Date Today Diagnosis L97.221 Non-pressure chronic ulcer of left calf limited to 03/31/2019 No Yes breakdown of skin L97.521 Non-pressure chronic ulcer of other part of left foot 03/31/2019 No Yes limited to breakdown of skin I87.331 Chronic venous hypertension  (idiopathic) with ulcer 03/31/2019 No Yes and inflammation of right lower extremity I89.0 Lymphedema, not elsewhere classified 03/31/2019 No Yes B37.2 Candidiasis of skin and nail 03/31/2019 No Yes Inactive Problems Resolved Problems Electronic Signature(s) Signed: 04/22/2019 4:52:33 PM By: Lenda Kelp PA-C Previous Signature: 04/21/2019 10:29:27 PM Version By: Lenda Kelp PA-C Entered By: Lenda Kelp on 04/22/2019 16:49:37 -------------------------------------------------------------------------------- Progress Note Details Patient Name: Date of Service: Catherine Wallace, Catherine Wallace 04/21/2019 11:00 AM Medical Record [id number] Patient Account Number: [id number] Date of Birth/Sex: Treating RN: 12-22-1950 (68 y.o. F) Primary Care Provider: Feliciana Rossetti Other Clinician: Referring Provider: Treating Provider/Extender:Stone III, Sandy Salaam, Ronette Deter in Treatment: 3 Subjective Chief Complaint Information obtained from Patient 03/31/2019; patient is here for review of wounds on her right lower leg and also asked to look at the skin in the buttocks right greater than left and groin area. History of Present Illness (HPI) ADMISSION 03/31/2019 This is a 68 year old woman who currently is at [facility] skilled facility. She is here predominantly for review of wound on the left posterior calf. The history behind this wound is very difficult to obtain. She says she has had weeping fluid in this area on and off for a year. Then she told me that the wound had only been open for 2 to 3 weeks. On her left lower extremity she has a fairly significant area on the left posterior calf 2 different areas including a major area superiorly and a smaller 1 just above the Achilles area. She has an area on the left lateral foot and the left dorsal foot which are small benign looking wounds. It looks as though they have been applying Xeroform. Most of her recent records in [place] [facility] seem to have  been involving a perineal rash. She has been in and out of hospital for left lower extremity cellulitis apparently in the area of the week quoted above she was actually sent to [facility] burn center and was admitted from 9/23 soon 9/28 where the area itself was biopsied and this was  felt to be a symmetrical drug-related intertriginous and flexural exanthema. I think this is a description rather than the diagnosis. I think the culprit medication was felt to be Bactrim. There was felt at this time to be lesions on the face, oral mucosa and genitalia [o Stevens-Johnson syndrome]. In any case she was hospitalized again from 10/7 through 10/13. She was found to have acute renal failure with an elevated CK. Noted to have recurrent falls. While in the hospital she was seen by the wound ostomy nurses who felt she had a scolding skin and they were using Aquacel, Gerhart's Butt cream. She was back in the ER on 10/20 Past medical history; recurrent cellulitis of the legs, PAD, OA of the knees, idiopathic peripheral neuropathy, intertriginous dermatitis, venous insufficiency with a history of ablations in the right leg apparently in 2018 but I have not seen this information, lymphedema. Remote right leg DVT per the patient We could not obtain ABIs on the left leg because of pain. Apparently there is arterial studies quoted in 2018 however this is on care everywhere and we could not open this. 11/10. We have the patient's left leg and foot and Hydrofera Blue under compression. She appears to be tolerating this 2 wounds on the foot and the more major wound on the left posterior calf. The area of absolutely terrible rash involving her buttocks right greater than left extending into the posterior thigh is still weeping angry and painful some of the angry erythema seems better at the margins and I wonder if we are making some progress. Since the patient was last here she broke her left arm I could barely get her over  enough to see this properly 04/21/2019 on evaluation today patient appears to be doing well with regard to the wounds at this point. She unfortunately is not doing a whole lot better but at least things do not seem to be getting significantly worse at this time there is still a lot of excoriation and skin breakdown unfortunately. I do feel like that the gluteal area is actually improving but again the yeast infection is not completely resolved in this regard. Overall she is making progress but not quite where we want her to be at this point. Patient History Information obtained from Patient. Family History Cancer - Mother,Father, Heart Disease - Maternal Grandparents, Stroke - Maternal Grandparents, Thyroid Problems - Mother, No family history of Diabetes, Hereditary Spherocytosis, Hypertension, Kidney Disease, Lung Disease, Seizures, Tuberculosis. Social History Current some day smoker - not smoking at facility, Marital Status - Widowed, Alcohol Use - Never, Drug Use - No History, Caffeine Use - Moderate - tea. Medical History Eyes Patient has history of Cataracts Denies history of Glaucoma, Optic Neuritis Ear/Nose/Mouth/Throat Denies history of Chronic sinus problems/congestion, Middle ear problems Cardiovascular Patient has history of Deep Vein Thrombosis - right leg, Peripheral Venous Disease Endocrine Denies history of Type I Diabetes, Type II Diabetes Musculoskeletal Patient has history of Osteoarthritis Neurologic Patient has history of Neuropathy - polyneuropathy Oncologic Denies history of Received Chemotherapy, Received Radiation Psychiatric Denies history of Anorexia/bulimia, Confinement Anxiety Hospitalization/Surgery History - lumbar surgery. - tonsillectomy. - carpal tunnel surgery. - tubal ligation. - c-section. Medical And Surgical History Notes Endocrine hypothyroidism Genitourinary overactive bladder Integumentary (Skin) intertriginous, flexeral  exanthema Neurologic spinal stenosis Psychiatric depression Review of Systems (ROS) Constitutional Symptoms (General Health) Denies complaints or symptoms of Fatigue, Fever, Chills, Marked Weight Change. Respiratory Denies complaints or symptoms of Chronic or frequent coughs, Shortness of Breath. Cardiovascular  Denies complaints or symptoms of Chest pain. Psychiatric Denies complaints or symptoms of Claustrophobia, Suicidal. Objective Constitutional Well-nourished and well-hydrated in no acute distress. Vitals Time Taken: 11:38 AM, Height: 58 in, Weight: 120 lbs, BMI: 25.1, Temperature: 97.9 F, Pulse: 79 bpm, Respiratory Rate: 18 breaths/min, Blood Pressure: 106/66 mmHg. Respiratory normal breathing without difficulty. clear to auscultation bilaterally. Cardiovascular regular rate and rhythm with normal S1, S2. Psychiatric this patient is able to make decisions and demonstrates good insight into disease process. Alert and Oriented x 3. pleasant and cooperative. General Notes: Upon inspection today patient's wounds again are shown signs of slight improvement especially in regard to the skin irritation in the gluteal region. With that being said she still has open wounds as well on the dorsal foot left posterior lower leg and these areas though not really measuring significantly larger also not really significantly smaller at this time. Integumentary (Hair, Skin) Wound #2 status is Open. Original cause of wound was Gradually Appeared. The wound is located on the Left,Dorsal Foot. The wound measures 0cm length x 0cm width x 0cm depth; 0cm^2 area and 0cm^3 volume. There is no tunneling or undermining noted. There is a none present amount of drainage noted. The wound margin is flat and intact. There is no granulation within the wound bed. There is no necrotic tissue within the wound bed. Wound #3 status is Open. Original cause of wound was Gradually Appeared. The wound is located on  the Left,Posterior Lower Leg. The wound measures 6cm length x 6cm width x 0.1cm depth; 28.274cm^2 area and 2.827cm^3 volume. There is Fat Layer (Subcutaneous Tissue) Exposed exposed. There is no tunneling or undermining noted. There is a medium amount of serosanguineous drainage noted. The wound margin is distinct with the outline attached to the wound base. There is medium (34-66%) red, hyper - granulation within the wound bed. There is a small (1-33%) amount of necrotic tissue within the wound bed including Adherent Slough. Wound #4 status is Open. Original cause of wound was Gradually Appeared. The wound is located on the Left,Distal,Posterior Lower Leg. The wound measures 2cm length x 1.5cm width x 0.1cm depth; 2.356cm^2 area and 0.236cm^3 volume. There is Fat Layer (Subcutaneous Tissue) Exposed exposed. There is no tunneling or undermining noted. There is a medium amount of serosanguineous drainage noted. The wound margin is distinct with the outline attached to the wound base. There is large (67-100%) red granulation within the wound bed. There is no necrotic tissue within the wound bed. Wound #5 status is Open. Original cause of wound was Pressure Injury. The wound is located on the Right Gluteus. The wound measures 0.2cm length x 0.2cm width x 0.1cm depth; 0.031cm^2 area and 0.003cm^3 volume. There is Fat Layer (Subcutaneous Tissue) Exposed exposed. There is no tunneling or undermining noted. There is a medium amount of serosanguineous drainage noted. The wound margin is distinct with the outline attached to the wound base. There is large (67-100%) red granulation within the wound bed. There is no necrotic tissue within the wound bed. 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 There was a Three Layer Compression Therapy Procedure by Yevonne PaxEpps, Carrie, RN.  Post procedure Diagnosis Wound #2: Same as Pre-Procedure Wound #3 Pre-procedure diagnosis of Wound #3 is a Lymphedema located on the Left,Posterior Lower Leg . There was a Three Layer Compression Therapy Procedure by Carlene Coria, RN. Post procedure Diagnosis Wound #3: Same as Pre-Procedure Wound #4 Pre-procedure diagnosis of Wound #4 is a Lymphedema located on the Left,Distal,Posterior Lower Leg . There was a Three Layer Compression Therapy Procedure by Carlene Coria, RN. Post procedure Diagnosis Wound #4: Same as Pre-Procedure Plan Follow-up Appointments: Return Appointment in 2 weeks. Dressing Change Frequency: Wound #2 Left,Dorsal Foot: Change Dressing every other day. Wound #3 Left,Posterior Lower Leg: Change Dressing every other day. Wound #4 Left,Distal,Posterior Lower Leg: Change Dressing every other day. Wound #5 Right Gluteus: Other: - 3 times per day Skin Barriers/Peri-Wound Care: Wound #2 Left,Dorsal Foot: Barrier cream TCA Cream or Ointment - liberally Wound #3 Left,Posterior Lower Leg: Barrier cream TCA Cream or Ointment - liberally Wound #4 Left,Distal,Posterior Lower Leg: Barrier cream TCA Cream or Ointment - liberally Wound Cleansing: Wound #2 Left,Dorsal Foot: Clean wound with Normal Saline. Wound #3 Left,Posterior Lower Leg: Clean wound with Normal Saline. Wound #4 Left,Distal,Posterior Lower Leg: Clean wound with Normal Saline. Wound #5 Right Gluteus: Clean wound with Normal Saline. Primary Wound Dressing: Wound #2 Left,Dorsal Foot: Hydrofera Blue - apply adaptic prior to application of hydrofera blue CLASSIC, moisten with normal saline Wound #3 Left,Posterior Lower Leg: Hydrofera  Blue - apply adaptic prior to application of hydrofera blue CLASSIC, moisten with normal saline Wound #4 Left,Distal,Posterior Lower Leg: Hydrofera Blue - apply adaptic prior to application of hydrofera blue CLASSIC, moisten with normal saline Wound #5 Right Gluteus: Other: - lotrison cream Secondary Dressing: Wound #2 Left,Dorsal Foot: ABD pad Wound #3 Left,Posterior Lower Leg: ABD pad Wound #4 Left,Distal,Posterior Lower Leg: ABD pad Edema Control: Wound #2 Left,Dorsal Foot: 3 Layer Compression System - Left Lower Extremity Wound #3 Left,Posterior Lower Leg: 3 Layer Compression System - Left Lower Extremity Wound #4 Left,Distal,Posterior Lower Leg: 3 Layer Compression System - Left Lower Extremity 1. My suggestion at this point is good to be that we go ahead and continue with the Lotrimin cream for the gluteal region as that seems to be doing well though she does need to make sure that the facility is actually applying this 3 times a day as prescribed. 2. I would recommend as well we continue with the barrier cream and TCA to the lower extremity wounds. We will cover this with [facility] and this hopefully will continue to help show things improve little by little she has noted some improvement as she is not quite as good as I would like to really see. 3. I am also can recommend that we go ahead and continue with the 3 layer compression wrap for the left lower extremity. We will see patient back for reevaluation in 2 weeks here in the clinic. If anything worsens or changes patient will contact our office for additional recommendations. Electronic Signature(s) Signed: 04/22/2019 4:52:33 PM By: Worthy Keeler PA-C Entered By: Worthy Keeler on 04/22/2019 16:51:37 -------------------------------------------------------------------------------- Catherine Wallace, Catherine Wallace 04/21/2019 11:00 AM Medical Record [id number] Patient Account Number:  [id number] Date of Birth/Sex: Treating RN: May 28, 1951 (68 y.o. F) Primary Care Provider: Gilford Rile Other Clinician: Referring Provider: Treating Provider/Extender:Stone III, Lesleigh Noe, [employer] in Treatment: 3 Information Obtained From Patient Constitutional Symptoms (General Health) Complaints and Symptoms: Negative for: Fatigue; Fever; Chills; Marked Weight Change Respiratory Complaints and Symptoms: Negative  for: Chronic or frequent coughs; Shortness of Breath Cardiovascular Complaints and Symptoms: Negative for: Chest pain Medical History: Positive for: Deep Vein Thrombosis - right leg; Peripheral Venous Disease Psychiatric Complaints and Symptoms: Negative for: Claustrophobia; Suicidal Medical History: Negative for: Anorexia/bulimia; Confinement Anxiety Past Medical History Notes: depression Eyes Medical History: Positive for: Cataracts Negative for: Glaucoma; Optic Neuritis Ear/Nose/Mouth/Throat Medical History: Negative for: Chronic sinus problems/congestion; Middle ear problems Endocrine Medical History: Negative for: Type I Diabetes; Type II Diabetes Past Medical History Notes: hypothyroidism Genitourinary Medical History: Past Medical History Notes: overactive bladder Integumentary (Skin) Medical History: Past Medical History Notes: intertriginous, flexeral exanthema Musculoskeletal Medical History: Positive for: Osteoarthritis Neurologic Medical History: Positive for: Neuropathy - polyneuropathy Past Medical History Notes: spinal stenosis Oncologic Medical History: Negative for: Received Chemotherapy; Received Radiation HBO Extended History Items Eyes: Cataracts Immunizations Pneumococcal Vaccine: Received Pneumococcal Vaccination: Yes Implantable Devices None Hospitalization / Surgery History Type of Hospitalization/Surgery lumbar surgery tonsillectomy carpal tunnel surgery tubal ligation c-section Family and Social  History Cancer: Yes - Mother,Father; Diabetes: No; Heart Disease: Yes - Maternal Grandparents; Hereditary Spherocytosis: No; Hypertension: No; Kidney Disease: No; Lung Disease: No; Seizures: No; Stroke: Yes - Maternal Grandparents; Thyroid Problems: Yes - Mother; Tuberculosis: No; Current some day smoker - not smoking at facility; Marital Status - Widowed; Alcohol Use: Never; Drug Use: No History; Caffeine Use: Moderate - tea; Financial Concerns: No; Food, Clothing or Shelter Needs: No; Support System Lacking: No; Transportation Concerns: No Physician Affirmation I have reviewed and agree with the above information. Electronic Signature(s) Signed: 04/22/2019 4:52:33 PM By: Lenda Kelp PA-C Entered By: Lenda Kelp on 04/22/2019 16:50:35 -------------------------------------------------------------------------------- SuperBill Details Patient Name: Date of Service: Catherine Wallace, Catherine Wallace 04/21/2019 Medical Record [id number] Patient Account Number: [id number] Date of Birth/Sex: Treating RN: May 23, 1951 (68 y.o. 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 I87.331 Chronic venous hypertension (idiopathic) with ulcer and inflammation of right lower extremity I89.0 Lymphedema, not elsewhere classified B37.2 Candidiasis of skin and nail Facility Procedures CPT4 Code Description: 95621308 (Facility Use Only) [phone] - APPLY MULTLAY COMPRS LWR LT LEG Modifier: Quantity: 1 Physician Procedures CPT4 Code Description: [id number] [id number] - WC PHYS LEVEL 4 - EST PT ICD-10 Diagnosis Description L97.221 Non-pressure chronic ulcer of left calf limited to breakd L97.521  Non-pressure chronic ulcer of other part of left foot lim I87.331 Chronic venous  hypertension (idiopathic) with ulcer and i lower extremity I89.0 Lymphedema, not elsewhere classified Modifier: own of skin ited to breakdow nflammation of r Quantity: 1 n of skin ight Electronic Signature(s) Signed: 04/22/2019 4:52:33 PM By: Lenda Kelp PA-C Previous Signature: 04/21/2019 10:29:27 PM Version By: Lenda Kelp PA-C Entered By: Lenda Kelp on 04/22/2019 16:51:51

## 2019-05-05 NOTE — Progress Notes (Signed)
Catherine, Wallace ([id number]) Visit Report for 04/07/2019 HPI Details Patient Name: Date of Service: Catherine, Wallace 04/07/2019 11:00 AM Medical Record [id number] Patient Account Number: [id number] Date of Birth/Sex: Treating RN: [birth year] (68 y.o. Catherine Wallace Primary Care Provider: Feliciana Wallace Other Clinician: Referring Provider: Treating Provider/Extender:Catherine Wallace, Catherine Wallace, Catherine Wallace in Treatment: 1 History of Present Illness HPI Description: ADMISSION 03/31/2019 This is a 68 year old woman who currently is at [facility] skilled facility. She is here predominantly for review of wound on the left posterior calf. The history behind this wound is very difficult to obtain. She says she has had weeping fluid in this area on and off for a year. Then she told me that the wound had only been open for 2 to 3 weeks. On her left lower extremity she has a fairly significant area on the left posterior calf 2 different areas including a major area superiorly and a smaller 1 just above the Achilles area. She has an area on the left lateral foot and the left dorsal foot which are small benign looking wounds. It looks as though they have been applying Xeroform. Most of her recent records in [place] [facility] seem to have been involving a perineal rash. She has been in and out of hospital for left lower extremity cellulitis apparently in the area of the week quoted above she was actually sent to [facility] burn center and was admitted from 9/23 soon 9/28 where the area itself was biopsied and this was felt to be a symmetrical drug-related intertriginous and flexural exanthema. I think this is a description rather than the diagnosis. I think the culprit medication was felt to be Bactrim. There was felt at this time to be lesions on the face, oral mucosa and genitalia [o Stevens-Johnson syndrome]. In any case she was hospitalized again from 10/7 through 10/13. She was found to have acute renal failure  with an elevated CK. Noted to have recurrent falls. While in the hospital she was seen by the wound ostomy nurses who felt she had a scolding skin and they were using Aquacel, Gerhart's Butt cream. She was back in the ER on 10/20 Past medical history; recurrent cellulitis of the legs, PAD, OA of the knees, idiopathic peripheral neuropathy, intertriginous dermatitis, venous insufficiency with a history of ablations in the right leg apparently in 2018 but I have not seen this information, lymphedema. Remote right leg DVT per the patient We could not obtain ABIs on the left leg because of pain. Apparently there is arterial studies quoted in 2018 however this is on care everywhere and we could not open this. 11/10. We have the patient's left leg and foot and Hydrofera Blue under compression. She appears to be tolerating this 2 wounds on the foot and the more major wound on the left posterior calf. The area of absolutely terrible rash involving her buttocks right greater than left extending into the posterior thigh is still weeping angry and painful some of the angry erythema seems better at the margins and I wonder if we are making some progress. Since the patient was last here she broke her left arm I could barely get her over enough to see this properly Electronic Signature(s) Signed: 04/07/2019 5:17:49 PM By: Catherine Najjar MD Entered By: Catherine Wallace on 04/07/2019 13:22:28 -------------------------------------------------------------------------------- Physical Exam Details Patient Name: Date of Service: Catherine, Wallace 04/07/2019 11:00 AM Medical Record [id number] Patient Account Number: [id number] Date of Birth/Sex: Treating RN: 04-04-1951 (68 y.o. Catherine Wallace Primary Care Provider:  Feliciana Wallace Other Clinician: Referring Provider: Treating Provider/Extender:Koren Plyler, Catherine Wallace, Catherine Wallace in Treatment: 1 Constitutional Sitting or standing Blood Pressure is within target  range for patient.. Pulse regular and within target range for patient.Marland Kitchen Respirations regular, non-labored and within target range.. Temperature is normal and within the target range for the patient.Marland Kitchen Appears in no distress. Eyes Conjunctivae clear. No discharge.no icterus. Respiratory work of breathing is normal. Cardiovascular Dorsalis pedis and posterior tibial pulses are palpable on the left. Musculoskeletal Left arm is in an immobilizer. Integumentary (Hair, Skin) Extensive rash as noted. Psychiatric Patient appears depressed today.. Notes Wound exam; left posterior calf there is a larger wound and then a smaller wound more distally towards the insertion point of the Achilles both of these look quite good today. The left lateral foot is closed. Left dorsal foot looks satisfactory. Extensive rash on her buttocks extending into the lower thighs. This was labeled as some form of drug eruption during an evaluation at [facility] although it is certainly not better. I told him that I would empirically treat this as a extensive fungal dermatitis using Diflucan and Lotrisone. Exteriors of the wound circumference looks some better cautious optimism Electronic Signature(s) Signed: 04/07/2019 5:17:49 PM By: Catherine Najjar MD Entered By: Catherine Wallace on 04/07/2019 13:25:45 -------------------------------------------------------------------------------- Physician Orders Details Patient Name: Date of Service: Catherine, Wallace 04/07/2019 11:00 AM Medical Record [id number] Patient Account Number: [id number] Date of Birth/Sex: Treating RN: March 22, 1951 (68 y.o. Catherine Wallace Primary Care Provider: Feliciana Wallace Other Clinician: Referring Provider: Treating Provider/Extender:Catherine Wallace, Catherine Wallace, Catherine Wallace in Treatment: 1 Verbal / Phone Orders: No Diagnosis Coding ICD-10 Coding Code Description [phone] Non-pressure chronic ulcer of left calf limited to breakdown of skin L97.521  Non-pressure chronic ulcer of other part of left foot limited to breakdown of skin I87.331 Chronic venous hypertension (idiopathic) with ulcer and inflammation of right lower extremity I89.0 Lymphedema, not elsewhere classified B37.2 Candidiasis of skin and nail Follow-up Appointments Return Appointment in 2 weeks. Dressing Change Frequency Wound #2 Left,Dorsal Foot Change Dressing every other day. Wound #3 Left,Posterior Lower Leg Change Dressing every other day. Wound #4 Left,Distal,Posterior Lower Leg Change Dressing every other day. Wound #5 Right Gluteus Other: - 3 times per day Skin Barriers/Peri-Wound Care Wound #2 Left,Dorsal Foot Barrier cream TCA Cream or Ointment - liberally Wound #3 Left,Posterior Lower Leg Barrier cream TCA Cream or Ointment - liberally Wound #4 Left,Distal,Posterior Lower Leg Barrier cream TCA Cream or Ointment - liberally Wound Cleansing Wound #2 Left,Dorsal Foot Clean wound with Normal Saline. Wound #3 Left,Posterior Lower Leg Clean wound with Normal Saline. Wound #4 Left,Distal,Posterior Lower Leg Clean wound with Normal Saline. Wound #5 Right Gluteus Clean wound with Normal Saline. Primary Wound Dressing Wound #2 Left,Dorsal Foot Hydrofera Blue - CLASSIC, moisten with normal saline Wound #3 Left,Posterior Lower Leg Hydrofera Blue - CLASSIC, moisten with normal saline Wound #4 Left,Distal,Posterior Lower Leg Hydrofera Blue - CLASSIC, moisten with normal saline Wound #5 Right Gluteus Other: - lotrison cream Secondary Dressing Wound #2 Left,Dorsal Foot ABD pad Wound #3 Left,Posterior Lower Leg ABD pad Wound #4 Left,Distal,Posterior Lower Leg ABD pad Edema Control Wound #2 Left,Dorsal Foot 3 Layer Compression System - Left Lower Extremity Wound #3 Left,Posterior Lower Leg 3 Layer Compression System - Left Lower Extremity Wound #4 Left,Distal,Posterior Lower Leg 3 Layer Compression System - Left Lower Extremity Electronic  Signature(s) Signed: 04/07/2019 5:17:49 PM By: Catherine Najjar MD Signed: 05/05/2019 2:53:59 PM By: Yevonne Pax RN Entered By: Yevonne Pax on 04/07/2019 12:47:33 --------------------------------------------------------------------------------  Problem List Details Patient Name: Date of Service: Catherine, Wallace 04/07/2019 11:00 AM Medical Record [id number] Patient Account Number: [id number] Date of Birth/Sex: Treating RN: [birth year] (68 y.o. Catherine Wallace Primary Care Provider: Feliciana Wallace Other Clinician: Referring Provider: Treating Provider/Extender:Shandale Malak, Catherine Wallace, Catherine Wallace in Treatment: 1 Active Problems ICD-10 Evaluated Encounter Code Description Active Date Today Diagnosis L97.221 Non-pressure chronic ulcer of left calf limited to 03/31/2019 No Yes breakdown of skin L97.521 Non-pressure chronic ulcer of other part of left foot 03/31/2019 No Yes limited to breakdown of skin I87.331 Chronic venous hypertension (idiopathic) with ulcer 03/31/2019 No Yes and inflammation of right lower extremity I89.0 Lymphedema, not elsewhere classified 03/31/2019 No Yes B37.2 Candidiasis of skin and nail 03/31/2019 No Yes Inactive Problems Resolved Problems Electronic Signature(s) Signed: 04/07/2019 5:17:49 PM By: Catherine Najjar MD Entered By: Catherine Wallace on 04/07/2019 13:20:19 -------------------------------------------------------------------------------- Progress Note Details Patient Name: Date of Service: Catherine, Wallace 04/07/2019 11:00 AM Medical Record [id number] Patient Account Number: [id number] Date of Birth/Sex: Treating RN: 1950-11-06 (68 y.o. Catherine Wallace Primary Care Provider: Feliciana Wallace Other Clinician: Referring Provider: Treating Provider/Extender:Dabria Wadas, Catherine Wallace, Catherine Wallace in Treatment: 1 Subjective History of Present Illness (HPI) ADMISSION 03/31/2019 This is a 68 year old woman who currently is at [facility] skilled facility. She  is here predominantly for review of wound on the left posterior calf. The history behind this wound is very difficult to obtain. She says she has had weeping fluid in this area on and off for a year. Then she told me that the wound had only been open for 2 to 3 weeks. On her left lower extremity she has a fairly significant area on the left posterior calf 2 different areas including a major area superiorly and a smaller 1 just above the Achilles area. She has an area on the left lateral foot and the left dorsal foot which are small benign looking wounds. It looks as though they have been applying Xeroform. Most of her recent records in [place] [facility] seem to have been involving a perineal rash. She has been in and out of hospital for left lower extremity cellulitis apparently in the area of the week quoted above she was actually sent to [facility] burn center and was admitted from 9/23 soon 9/28 where the area itself was biopsied and this was felt to be a symmetrical drug-related intertriginous and flexural exanthema. I think this is a description rather than the diagnosis. I think the culprit medication was felt to be Bactrim. There was felt at this time to be lesions on the face, oral mucosa and genitalia [o Stevens-Johnson syndrome]. In any case she was hospitalized again from 10/7 through 10/13. She was found to have acute renal failure with an elevated CK. Noted to have recurrent falls. While in the hospital she was seen by the wound ostomy nurses who felt she had a scolding skin and they were using Aquacel, Gerhart's Butt cream. She was back in the ER on 10/20 Past medical history; recurrent cellulitis of the legs, PAD, OA of the knees, idiopathic peripheral neuropathy, intertriginous dermatitis, venous insufficiency with a history of ablations in the right leg apparently in 2018 but I have not seen this information, lymphedema. Remote right leg DVT per the patient We could not obtain ABIs on the  left leg because of pain. Apparently there is arterial studies quoted in 2018 however this is on care everywhere and we could not open this. 11/10. We have the patient's left leg and foot  and Hydrofera Blue under compression. She appears to be tolerating this 2 wounds on the foot and the more major wound on the left posterior calf. The area of absolutely terrible rash involving her buttocks right greater than left extending into the posterior thigh is still weeping angry and painful some of the angry erythema seems better at the margins and I wonder if we are making some progress. Since the patient was last here she broke her left arm I could barely get her over enough to see this properly Objective Constitutional Sitting or standing Blood Pressure is within target range for patient.. Pulse regular and within target range for patient.Marland Kitchen Respirations regular, non-labored and within target range.. Temperature is normal and within the target range for the patient.Marland Kitchen Appears in no distress. Vitals Time Taken: 11:59 AM, Height: 58 in, Weight: 120 lbs, BMI: 25.1, Temperature: 98.7 F, Pulse: 97 bpm, Respiratory Rate: 18 breaths/min, Blood Pressure: 121/78 mmHg. Eyes Conjunctivae clear. No discharge.no icterus. Respiratory work of breathing is normal. Cardiovascular Dorsalis pedis and posterior tibial pulses are palpable on the left. Musculoskeletal Left arm is in an immobilizer. Psychiatric Patient appears depressed today.. General Notes: Wound exam; left posterior calf there is a larger wound and then a smaller wound more distally towards the insertion point of the Achilles both of these look quite good today. The left lateral foot is closed. Left dorsal foot looks satisfactory. ooExtensive rash on her buttocks extending into the lower thighs. This was labeled as some form of drug eruption during an evaluation at [facility] although it is certainly not better. I told him that I would empirically  treat this as a extensive fungal dermatitis using Diflucan and Lotrisone. Exteriors of the wound circumference looks some better cautious optimism Integumentary (Hair, Skin) Extensive rash as noted. Wound #1 status is Healed - Epithelialized. Original cause of wound was Gradually Appeared. The wound is located on the Left,Lateral Foot. The wound measures 0cm length x 0cm width x 0cm depth; 0cm^2 area and 0cm^3 volume. There is no tunneling or undermining noted. There is a none present amount of drainage noted. The wound margin is flat and intact. There is no granulation within the wound bed. There is no necrotic tissue within the wound bed. Wound #2 status is Open. Original cause of wound was Gradually Appeared. The wound is located on the Left,Dorsal Foot. The wound measures 0.5cm length x 2.6cm width x 0.1cm depth; 1.021cm^2 area and 0.102cm^3 volume. There is Fat Layer (Subcutaneous Tissue) Exposed exposed. There is no tunneling or undermining noted. There is a small amount of serosanguineous drainage noted. The wound margin is flat and intact. There is large (67- 100%) red granulation within the wound bed. There is no necrotic tissue within the wound bed. Wound #3 status is Open. Original cause of wound was Gradually Appeared. The wound is located on the Left,Posterior Lower Leg. The wound measures 5cm length x 6.5cm width x 0.1cm depth; 25.525cm^2 area and 2.553cm^3 volume. There is Fat Layer (Subcutaneous Tissue) Exposed exposed. There is no tunneling or undermining noted. There is a medium amount of serous drainage noted. The wound margin is flat and intact. There is medium (34-66%) red, hyper - granulation within the wound bed. There is a medium (34-66%) amount of necrotic tissue within the wound bed including Eschar. Wound #4 status is Open. Original cause of wound was Gradually Appeared. The wound is located on the Left,Distal,Posterior Lower Leg. The wound measures 0.5cm length x  1.5cm width x 0.1cm depth;  0.589cm^2 area and 0.059cm^3 volume. There is Fat Layer (Subcutaneous Tissue) Exposed exposed. There is no tunneling or undermining noted. There is a small amount of serous drainage noted. The wound margin is flat and intact. There is large (67-100%) red granulation within the wound bed. There is no necrotic tissue within the wound bed. Wound #5 status is Open. Original cause of wound was Pressure Injury. The wound is located on the Right Gluteus. The wound measures 15cm length x 10cm width x 0.1cm depth; 117.81cm^2 area and 11.781cm^3 volume. The wound is limited to skin breakdown. There is no tunneling or undermining noted. There is a medium amount of serosanguineous drainage noted. The wound margin is flat and intact. There is large (67-100%) red granulation within the wound bed. There is no necrotic tissue within the wound bed. 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 There was a Three Layer Compression Therapy Procedure by Yevonne PaxEpps, Carrie, RN. Post procedure Diagnosis Wound #2: Same as Pre-Procedure Wound #3 Pre-procedure diagnosis of Wound #3 is a Lymphedema located on the Left,Posterior Lower Leg . There was a Three Layer Compression Therapy Procedure by Yevonne PaxEpps, Carrie, RN. Post procedure Diagnosis Wound #3: Same as Pre-Procedure Wound #4 Pre-procedure diagnosis of Wound #4 is a Lymphedema located on the Left,Distal,Posterior Lower Leg . There was a Three Layer Compression Therapy Procedure by Yevonne PaxEpps,  Carrie, RN. Post procedure Diagnosis Wound #4: Same as Pre-Procedure Wound #5 Pre-procedure diagnosis of Wound #5 is a Pressure Ulcer located on the Right Gluteus . There was a Three Layer Compression Therapy Procedure by Yevonne PaxEpps, Carrie, RN. Post procedure Diagnosis Wound #5: Same as Pre-Procedure Plan Follow-up Appointments: Return Appointment in 2 weeks. Dressing Change Frequency: Wound #2 Left,Dorsal Foot: Change Dressing every other day. Wound #3 Left,Posterior Lower Leg: Change Dressing every other day. Wound #4 Left,Distal,Posterior Lower Leg: Change Dressing every other day. Wound #5 Right Gluteus: Other: - 3 times per day Skin Barriers/Peri-Wound Care: Wound #2 Left,Dorsal Foot: Barrier cream TCA Cream or Ointment - liberally Wound #3 Left,Posterior Lower Leg: Barrier cream TCA Cream or Ointment - liberally Wound #4 Left,Distal,Posterior Lower Leg: Barrier cream TCA Cream or Ointment - liberally Wound Cleansing: Wound #2 Left,Dorsal Foot: Clean wound with Normal Saline. Wound #3 Left,Posterior Lower Leg: Clean wound with Normal Saline. Wound #4 Left,Distal,Posterior Lower Leg: Clean wound with Normal Saline. Wound #5 Right Gluteus: Clean wound with Normal Saline. Primary Wound Dressing: Wound #2 Left,Dorsal Foot: Hydrofera Blue - CLASSIC, moisten with normal saline Wound #3 Left,Posterior Lower Leg: Hydrofera Blue - CLASSIC, moisten with normal saline Wound #4 Left,Distal,Posterior Lower Leg: Hydrofera Blue - CLASSIC, moisten with normal saline Wound #5 Right Gluteus: Other: - lotrison cream Secondary Dressing: Wound #2 Left,Dorsal Foot: ABD pad Wound #3 Left,Posterior Lower Leg: ABD pad Wound #4 Left,Distal,Posterior Lower Leg: ABD pad Edema Control: Wound #2 Left,Dorsal Foot: 3 Layer Compression System - Left Lower Extremity Wound #3 Left,Posterior Lower Leg: 3 Layer Compression System - Left Lower Extremity Wound #4 Left,Distal,Posterior Lower  Leg: 3 Layer Compression System - Left Lower Extremity 1; Hydrofera Blue to continue on the left leg 2. I  am empirically treating the rash as an extensive fungal dermatitis. I am not certain of this however I have seen this sort of thing before and long-term care facilities. 3. She has broken her left arm even turning this old woman over is very painful. I could not get a good look at the left buttock at all the right buttock looks essentially the same although there is an area on the outside of this large area that appears to be better epithelialized Electronic Signature(s) Signed: 04/07/2019 5:17:49 PM By: Catherine Najjar MD Entered By: Catherine Wallace on 04/07/2019 13:27:59 -------------------------------------------------------------------------------- SuperBill Details Patient Name: Date of Service: Catherine, Wallace 04/07/2019 Medical Record [id number] Patient Account Number: [id number] Date of Birth/Sex: Treating RN: 1950-11-27 (68 y.o. Catherine Wallace Primary Care Provider: Feliciana Wallace Other Clinician: Referring Provider: Treating Provider/Extender:Kalisha Keadle, Catherine Wallace, Catherine Wallace in Treatment: 1 Diagnosis Coding ICD-10 Codes Code Description [phone] Non-pressure chronic ulcer of left calf limited to breakdown of skin L97.521 Non-pressure chronic ulcer of other part of left foot limited to breakdown of skin I87.331 Chronic venous hypertension (idiopathic) with ulcer and inflammation of right lower extremity I89.0 Lymphedema, not elsewhere classified B37.2 Candidiasis of skin and nail Facility Procedures Physician Procedures CPT4 Code Description: [id number] - WC PHYS LEVEL 3 - EST PT ICD-10 Diagnosis Description L97.221 Non-pressure chronic ulcer of left calf limited to breakd L97.521 Non-pressure chronic ulcer of other part of left foot lim I87.331 Chronic venous  hypertension (idiopathic) with ulcer and i lower extremity I89.0 Lymphedema, not elsewhere  classified Modifier: own of skin ited to breakdow nflammation of r Quantity: 1 n of skin ight Electronic Signature(s) Signed: 04/07/2019 5:17:49 PM By: Catherine Najjar MD Entered By: Catherine Wallace on 04/07/2019 13:28:27

## 2019-05-06 NOTE — Progress Notes (Signed)
Catherine Wallace, Catherine Wallace ([id number]) Visit Report for 03/31/2019 Allergy List Details Patient Name: Date of Service: Catherine Wallace, Catherine Wallace 03/31/2019 10:30 AM Medical Record [id number] Patient Account Number: [id number] Date of Birth/Sex: Treating RN: 1950/10/01 (68 y.o. Tommye Standard Primary Care Jerusha Reising: Feliciana Rossetti Other Clinician: Referring Wendelin Reader: Treating Analiya Porco/Extender:Robson, Sheron Nightingale, Ronette Deter in Treatment: 0 Allergies Active Allergies Sulfa (Sulfonamide Antibiotics) Reaction: rash Severity: Severe penicillin Reaction: nausea Severity: Moderate Allergy Notes Electronic Signature(s) Signed: 03/31/2019 6:11:25 PM By: Zenaida Deed RN, BSN Entered By: Zenaida Deed on 03/31/2019 11:05:42 -------------------------------------------------------------------------------- Arrival Information Details Patient Name: Date of Service: Catherine Wallace, Catherine Wallace 03/31/2019 10:30 AM Medical Record [id number] Patient Account Number: [id number] Date of Birth/Sex: Treating RN: [birth year] (68 y.o. Tommye Standard Primary Care Jassica Zazueta: Feliciana Rossetti Other Clinician: Referring Insiya Oshea: Treating Disa Riedlinger/Extender:Robson, Sheron Nightingale, Ronette Deter in Treatment: 0 Visit Information Patient Arrived: Wheel Chair Arrival Time: 10:58 Accompanied By: self Transfer Assistance: None Patient Identification Verified: Yes Secondary Verification Process Completed: Yes Patient Requires Transmission-Based No Precautions: Patient Catherine Wallace Alerts: No Electronic Signature(s) Signed: 03/31/2019 6:11:25 PM By: Zenaida Deed RN, BSN Entered By: Zenaida Deed on 03/31/2019 11:00:42 -------------------------------------------------------------------------------- Clinic Level of Care Assessment Details Patient Name: Date of Service: Catherine Wallace, Catherine Wallace 03/31/2019 10:30 AM Medical Record [id number] Patient Account Number: [id number] Date of Birth/Sex: Treating RN: Apr 15, 1951 (68 y.o. Freddy Finner Primary Care Blaiden Werth: Feliciana Rossetti Other Clinician: Referring Natalin Bible: Treating Kaian Fahs/Extender:Robson, Sheron Nightingale, Ronette Deter in Treatment: 0 Clinic Level of Care Assessment Items TOOL 1 Quantity Score X - Use when EandM and Procedure is performed on INITIAL visit 1 0 ASSESSMENTS - Nursing Assessment / Reassessment X - General Physical Exam (combine w/ comprehensive assessment (listed just below) 1 20 when performed on new pt. evals) X - Comprehensive Assessment (HX, ROS, Risk Assessments, Wounds Hx, etc.) 1 25 ASSESSMENTS - Wound and Skin Assessment / Reassessment  - Dermatologic / Skin Assessment (not related to wound area) 0 ASSESSMENTS - Ostomy and/or Continence Assessment and Care  - Incontinence Assessment and Management 0  - Ostomy Care Assessment and Management (repouching, etc.) 0 PROCESS - Coordination of Care X - Simple Patient / Family Education for ongoing care 1 15  - Complex (extensive) Patient / Family Education for ongoing care 0  - Staff obtains Chiropractor, Records, Test Results / Process Orders 0  - Staff telephones HHA, Nursing Homes / Clarify orders / etc 0  - Routine Transfer to another Facility (non-emergent condition) 0  - Routine Hospital Admission (non-emergent condition) 0 X - New Admissions / Manufacturing engineer / Ordering NPWT, Apligraf, etc. 1 15  - Emergency Hospital Admission (emergent condition) 0 PROCESS - Special Needs  - Pediatric / Minor Patient Management 0  - Isolation Patient Management 0  - Hearing / Language / Visual special needs 0  - Assessment of Community assistance (transportation, D/C planning, etc.) 0  - Additional assistance / Altered mentation 0  - Support Surface(s) Assessment (bed, cushion, seat, etc.) 0 INTERVENTIONS - Miscellaneous  - External ear exam 0  - Patient Transfer (multiple staff / Nurse, adult / Similar devices) 0  - Simple Staple / Suture removal (25 or less)  0  - Complex Staple / Suture removal (26 or more) 0  - Hypo/Hyperglycemic Management (do not check if billed separately) 0 X - Ankle / Brachial Index (ABI) - do not check if billed separately 1 15 Catherine Wallace the patient been seen at the hospital within the last three years: Yes Total Score: 90 Level Of Care: New/Established - Level  3 Electronic Signature(s) Signed: 05/06/2019 12:05:18 PM By: Yevonne Pax RN Entered By: Yevonne Pax on 03/31/2019 12:06:54 -------------------------------------------------------------------------------- Compression Therapy Details Patient Name: Date of Service: Catherine Wallace, Catherine Wallace 03/31/2019 10:30 AM Medical Record [id number] Patient Account Number: [id number] Date of Birth/Sex: Treating RN: 1950/09/15 (68 y.o. Freddy Finner Primary Care Ja Pistole: Feliciana Rossetti Other Clinician: Referring Enrrique Mierzwa: Treating Jago Carton/Extender:Robson, Sheron Nightingale, Ronette Deter in Treatment: 0 Compression Therapy Performed for Wound Wound #1 Left,Lateral Foot Assessment: Performed By: Clinician Yevonne Pax, RN Compression Type: Three Layer Post Procedure Diagnosis Same as Pre-procedure Electronic Signature(s) Signed: 05/06/2019 12:05:18 PM By: Yevonne Pax RN Entered By: Yevonne Pax on 03/31/2019 12:11:02 -------------------------------------------------------------------------------- Compression Therapy Details Patient Name: Date of Service: Catherine Wallace, Catherine Wallace 03/31/2019 10:30 AM Medical Record [id number] Patient Account Number: [id number] Date of Birth/Sex: Treating RN: September 09, 1950 (68 y.o. Freddy Finner Primary Care Neriyah Cercone: Feliciana Rossetti Other Clinician: Referring Duong Haydel: Treating Jasan Doughtie/Extender:Robson, Sheron Nightingale, Ronette Deter in Treatment: 0 Compression Therapy Performed for Wound Wound #2 Left,Dorsal Foot Assessment: Performed By: Little Ishikawa, RN Compression Type: Three Layer Post Procedure Diagnosis Same as Pre-procedure Electronic  Signature(s) Signed: 05/06/2019 12:05:18 PM By: Yevonne Pax RN Entered By: Yevonne Pax on 03/31/2019 12:11:03 -------------------------------------------------------------------------------- Compression Therapy Details Patient Name: Date of Service: Catherine Wallace, Catherine Wallace 03/31/2019 10:30 AM Medical Record [id number] Patient Account Number: [id number] Date of Birth/Sex: Treating RN: 25-Mar-1951 (68 y.o. Freddy Finner Primary Care Dereon Williamsen: Feliciana Rossetti Other Clinician: Referring Tita Terhaar: Treating Monroe Qin/Extender:Robson, Sheron Nightingale, Ronette Deter in Treatment: 0 Compression Therapy Performed for Wound Wound #3 Left,Posterior Lower Leg Assessment: Performed By: Clinician Yevonne Pax, RN Compression Type: Three Layer Post Procedure Diagnosis Same as Pre-procedure Electronic Signature(s) Signed: 05/06/2019 12:05:18 PM By: Yevonne Pax RN Entered By: Yevonne Pax on 03/31/2019 12:11:03 -------------------------------------------------------------------------------- Compression Therapy Details Patient Name: Date of Service: Catherine Wallace, Catherine Wallace 03/31/2019 10:30 AM Medical Record [id number] Patient Account Number: [id number] Date of Birth/Sex: Treating RN: June 24, 1950 (68 y.o. Freddy Finner Primary Care Teriah Muela: Feliciana Rossetti Other Clinician: Referring Lashuna Tamashiro: Treating Janisse Ghan/Extender:Robson, Sheron Nightingale, Ronette Deter in Treatment: 0 Compression Therapy Performed for Wound Wound #4 Left,Distal,Posterior Lower Leg Assessment: Performed By: Little Ishikawa, RN Compression Type: Three Layer Post Procedure Diagnosis Same as Pre-procedure Electronic Signature(s) Signed: 05/06/2019 12:05:18 PM By: Yevonne Pax RN Entered By: Yevonne Pax on 03/31/2019 12:11:03 -------------------------------------------------------------------------------- Encounter Discharge Information Details Patient Name: Date of Service: Catherine Wallace, Catherine Wallace 03/31/2019 10:30 AM Medical Record [id number]  Patient Account Number: [id number] Date of Birth/Sex: Treating RN: [birth year] (68 y.o. Harvest Dark Primary Care Shelle Galdamez: Feliciana Rossetti Other Clinician: Referring Trequan Marsolek: Treating Ashad Fawbush/Extender:Robson, Sheron Nightingale, Ronette Deter in Treatment: 0 Encounter Discharge Information Items Discharge Condition: Stable Ambulatory Status: Wheelchair Discharge Destination: Skilled Nursing Facility Telephoned: No Orders Sent: Yes Transportation: Other Accompanied By: caregiver Schedule Follow-up Appointment: Yes Clinical Summary of Care: Patient Declined Electronic Signature(s) Signed: 04/02/2019 5:16:49 PM By: Cherylin Mylar Entered By: Cherylin Mylar on 03/31/2019 12:38:51 -------------------------------------------------------------------------------- Lower Extremity Assessment Details Patient Name: Date of Service: Catherine Wallace, Catherine Wallace 03/31/2019 10:30 AM Medical Record [id number] Patient Account Number: [id number] Date of Birth/Sex: Treating RN: 12/28/1950 (68 y.o. Tommye Standard Primary Care Kimela Malstrom: Feliciana Rossetti Other Clinician: Referring Teva Bronkema: Treating Byrne Capek/Extender:Robson, Sheron Nightingale, Ronette Deter in Treatment: 0 Edema Assessment Assessed: [Left: No] [Right: No] Edema: [Left: Ye] [Right: s] Calf Left: Right: Point of Measurement: cm From Medial Instep 39.4 cm cm Ankle Left: Right: Point of Measurement: cm From Medial Instep 25.9 cm cm Vascular Assessment Pulses: Dorsalis Pedis Palpable: [Left:Yes] Notes unable to tolerate ABI testing secondary to  pain Electronic Signature(s) Signed: 03/31/2019 6:11:25 PM By: Zenaida DeedBoehlein, Linda RN, BSN Entered By: Zenaida DeedBoehlein, Linda on 03/31/2019 11:44:38 -------------------------------------------------------------------------------- Multi Wound Chart Details Patient Name: Date of Service: Catherine Wallace, Catherine Wallace 03/31/2019 10:30 AM Medical Record [id number] Patient Account Number: [id number] Date of Birth/Sex: Treating  RN: 08/24/1950 (68 y.o. Freddy FinnerF) Epps, Carrie Primary Care Shawntez Dickison: Feliciana RossettiGrisso, Greg Other Clinician: Referring Jesus Nevills: Treating Tyauna Lacaze/Extender:Robson, Sheron NightingaleMichael Grisso, Ronette DeterGreg Weeks in Treatment: 0 Vital Signs Height(in): 58 Pulse(bpm): 87 Weight(lbs): 120 Blood Pressure(mmHg): 107/58 Body Mass Index(BMI): 25 Temperature(F): 98.7 Respiratory 18 Rate(breaths/min): Photos: [1:No Photos] [2:No Photos] [3:No Photos] Wound Location: [1:Left Foot - Lateral] [2:Left Foot - Dorsal] [3:Left Lower Leg - Posterior] Wounding Event: [1:Gradually Appeared] [2:Gradually Appeared] [3:Gradually Appeared] Primary Etiology: [1:Atypical] [2:Atypical] [3:Atypical] Comorbid History: [1:Cataracts, Deep Vein Thrombosis, Peripheral Venous Disease, Osteoarthritis, Neuropathy Osteoarthritis, Neuropathy] [2:Cataracts, Deep Vein Thrombosis, Peripheral Venous Disease,] [3:Cataracts, Deep Vein Thrombosis, Peripheral Venous  Disease, Osteoarthritis, Neuropathy] Date Acquired: [1:03/28/2018] [2:03/28/2018] [3:03/28/2018] Weeks of Treatment: [1:0] [2:0] [3:0] Wound Status: [1:Open] [2:Open] [3:Open] Measurements L x W x D 1.3x1.2x0.1 [2:1x0.6x0.1] [3:5.2x6.5x0.1] (cm) Area (cm) : [1:1.225] [2:0.471] [3:26.546] Volume (cm) : [1:0.123] [2:0.047] [3:2.655] Classification: [1:Full Thickness Without Exposed Support Structures] [2:Partial Thickness] [3:Full Thickness Without Exposed Support Structures] Exudate Amount: [1:Small] [2:Small] [3:Medium] Exudate Type: [1:Serous] [2:Serosanguineous] [3:Serous] Exudate Color: [1:amber] [2:red, brown] [3:amber] Wound Margin: [1:Flat and Intact] [2:Flat and Intact] [3:Flat and Intact] Granulation Amount: [1:Large (67-100%)] [2:Large (67-100%)] [3:Large (67-100%)] Granulation Quality: [1:Red] [2:Red] [3:Red, Hyper-granulation] Necrotic Amount: [1:None Present (0%)] [2:None Present (0%)] [3:None Present (0%)] Exposed Structures: [1:Fat Layer (Subcutaneous Fascia: No Tissue) Exposed: Yes  Fascia: No Tendon: No Muscle: No Joint: No Bone: No] [2:Fat Layer (Subcutaneous Tissue) Exposed: No Tendon: No Muscle: No Joint: No Bone: No Limited to Skin Breakdown] [3:Fat Layer (Subcutaneous  Tissue) Exposed: Yes Fascia: No Tendon: No Muscle: No Joint: No Bone: No] Epithelialization: [1:Small (1-33%)] [2:Medium (34-66%)] [3:Small (1-33%)] Procedures Performed: Compression Therapy [2:Compression Therapy 4 N/A] [3:Compression Therapy N/A] Photos: [1:No Photos] [2:N/A] [3:N/A] Wound Location: [1:Left Lower Leg - Posterior, Distal] [2:N/A] [3:N/A] Wounding Event: [1:Gradually Appeared] [2:N/A] [3:N/A] Primary Etiology: [1:Atypical] [2:N/A] [3:N/A] Comorbid History: [1:Cataracts, Deep Vein Thrombosis, Peripheral Venous Disease, Osteoarthritis, Neuropathy] [2:N/A] [3:N/A] Date Acquired: [1:03/28/2018] [2:N/A] [3:N/A] Weeks of Treatment: [1:0] [2:N/A] [3:N/A] Wound Status: [1:Open] [2:N/A] [3:N/A] Measurements L x W x D 0.6x2.5x0.1 [2:N/A] [3:N/A] (cm) Area (cm) : [1:1.178] [2:N/A] [3:N/A] Volume (cm) : [1:0.118] [2:N/A] [3:N/A] Classification: [1:Full Thickness Without Exposed Support Structures] [2:N/A] [3:N/A] Exudate Amount: [1:Small] [2:N/A N/A] Exudate Type: [1:Serous] [2:N/A N/A] Exudate Color: [1:amber] [2:N/A N/A] Wound Margin: [1:Flat and Intact] [2:N/A N/A] Granulation Amount: [1:Large (67-100%)] [2:N/A N/A] Granulation Quality: [1:Red, Hyper-granulation] [2:N/A N/A] Necrotic Amount: [1:None Present (0%)] [2:N/A N/A] Exposed Structures: [1:Fat Layer (Subcutaneous N/A Tissue) Exposed: Yes Fascia: No Tendon: No Muscle: No Joint: No Bone: No] [2:N/A] Epithelialization: [1:Small (1-33%) Compression Therapy] [2:N/A N/A N/A N/A] Treatment Notes Wound #1 (Left, Lateral Foot) 1. Cleanse With Wound Cleanser Soap and water 2. Periwound Care Barrier cream TCA Cream 3. Primary Dressing Applied Hydrofera Blue 4. Secondary Dressing ABD Pad 6. Support Layer Applied 3 layer  compression wrap Wound #2 (Left, Dorsal Foot) 1. Cleanse With Wound Cleanser Soap and water 2. Periwound Care Barrier cream TCA Cream 3. Primary Dressing Applied Hydrofera Blue 4. Secondary Dressing ABD Pad 6. Support Layer Applied 3 layer compression wrap Wound #3 (Left, Posterior Lower Leg) 1. Cleanse With Wound Cleanser Soap and water 2. Periwound Care Barrier cream TCA Cream 3. Primary Dressing Applied Hydrofera Blue 4.  Secondary Dressing ABD Pad 6. Support Layer Applied 3 layer compression wrap Wound #4 (Left, Distal, Posterior Lower Leg) 1. Cleanse With Wound Cleanser Soap and water 2. Periwound Care Barrier cream TCA Cream 3. Primary Dressing Applied Hydrofera Blue 4. Secondary Dressing ABD Pad 6. Support Layer Applied 3 layer compression Cytogeneticist) Signed: 03/31/2019 6:03:12 PM By: Baltazar Najjar MD Signed: 05/06/2019 12:05:18 PM By: Yevonne Pax RN Entered By: Baltazar Najjar on 03/31/2019 13:15:52 -------------------------------------------------------------------------------- Multi-Disciplinary Care Plan Details Patient Name: Date of Service: Catherine Wallace, Catherine Wallace 03/31/2019 10:30 AM Medical Record [id number] Patient Account Number: [id number] Date of Birth/Sex: Treating RN: [birth year] (68 y.o. Freddy Finner Primary Care Tashai Catino: Feliciana Rossetti Other Clinician: Referring Neville Walston: Treating Letty Salvi/Extender:Robson, Sheron Nightingale, Ronette Deter in Treatment: 0 Active Inactive Wound/Skin Impairment Nursing Diagnoses: Knowledge deficit related to ulceration/compromised skin integrity Goals: Patient/caregiver will verbalize understanding of skin care regimen Date Initiated: 03/31/2019 Target Resolution Date: 04/24/2019 Goal Status: Active Ulcer/skin breakdown will have a volume reduction of 30% by week 4 Date Initiated: 03/31/2019 Target Resolution Date: 04/24/2019 Goal Status: Active Interventions: Assess patient/caregiver ability  to obtain necessary supplies Assess patient/caregiver ability to perform ulcer/skin care regimen upon admission and as needed Assess ulceration(s) every visit Notes: Electronic Signature(s) Signed: 05/06/2019 12:05:18 PM By: Yevonne Pax RN Entered By: Yevonne Pax on 03/31/2019 11:57:52 -------------------------------------------------------------------------------- Pain Assessment Details Patient Name: Date of Service: Catherine Wallace, Catherine Wallace 03/31/2019 10:30 AM Medical Record [id number] Patient Account Number: [id number] Date of Birth/Sex: Treating RN: [birth year] (68 y.o. Tommye Standard Primary Care Marlyne Totaro: Feliciana Rossetti Other Clinician: Referring Apolonio Cutting: Treating Gilma Bessette/Extender:Robson, Sheron Nightingale, Ronette Deter in Treatment: 0 Active Problems Location of Pain Severity and Description of Pain Patient Catherine Wallace Paino Yes Site Locations Pain Location: Pain in Ulcers With Dressing Change: Yes Duration of the Pain. Constant / Intermittento Constant Rate the pain. Current Pain Level: 4 Worst Pain Level: 5 Least Pain Level: 0 Character of Pain Describe the Pain: Other: stinging Pain Management and Medication Current Pain Management: Medication: Yes Is the Current Pain Management Adequate: Adequate How does your wound impact your activities of daily livingo Sleep: No Bathing: No Appetite: No Relationship With Others: No Bladder Continence: No Emotions: No Bowel Continence: No Hobbies: No Toileting: No Dressing: No Electronic Signature(s) Signed: 03/31/2019 6:11:25 PM By: Zenaida Deed RN, BSN Entered By: Zenaida Deed on 03/31/2019 11:36:09 -------------------------------------------------------------------------------- Patient/Caregiver Education Details Patient Name: Date of Service: HASSET, CHAVIANO 11/3/2020andnbsp10:30 AM Medical Record [phone] Patient Account Number: [id number] Date of Birth/Gender: March 29, 1951 (68 y.o. F) Treating RN: Yevonne Pax Primary  Care Physician: Feliciana Rossetti Other Clinician: Referring Physician: Treating Physician/Extender:Robson, Sheron Nightingale, Ronette Deter in Treatment: 0 Education Assessment Education Provided To: Patient Education Topics Provided Wound/Skin Impairment: Methods: Explain/Verbal Responses: State content correctly Electronic Signature(s) Signed: 05/06/2019 12:05:18 PM By: Yevonne Pax RN Entered By: Yevonne Pax on 03/31/2019 11:58:02 -------------------------------------------------------------------------------- Wound Assessment Details Patient Name: Date of Service: LASASHA, BROPHY 03/31/2019 10:30 AM Medical Record [id number] Patient Account Number: [id number] Date of Birth/Sex: Treating RN: [birth year] (68 y.o. Tommye Standard Primary Care Adessa Primiano: Feliciana Rossetti Other Clinician: Referring [place]: Treating Chizara Mena/Extender:Robson, Sheron Nightingale, Ronette Deter in Treatment: 0 Wound Status Wound Number: 1 Primary Lymphedema Etiology: Wound Location: Left Foot - Lateral Wound Open Wounding Event: Gradually Appeared Status: Date Acquired: 03/28/2018 Comorbid Cataracts, Deep Vein Thrombosis, Peripheral Weeks Of Treatment: 0 History: Venous Disease, Osteoarthritis, Neuropathy Clustered Wound: No Photos Wound Measurements Length: (cm) 1.3 % Reduc Width: (cm) 1.2 % Reduc Depth: (cm) 0.1 Epithel Area: (cm) 1.225 Tunnel Volume: (cm)  0.123 Underm Wound Description Full Thickness Without Exposed Support Foul O Classification: Structures Slough Wound Flat and Intact Margin: Exudate Small Amount: Exudate Serous Type: Exudate amber Color: Wound Bed Granulation Amount: Large (67-100%) Granulation Quality: Red Fascia Necrotic Amount: None Present (0%) Fat La Tendon Muscle Joint Bone E dor After Cleansing: No /Fibrino No Exposed Structure Exposed: No yer (Subcutaneous Tissue) Exposed: Yes Exposed: No Exposed: No Exposed: No xposed: No tion in Area: 0% tion in  Volume: 0% ialization: Small (1-33%) ing: No ining: No Electronic Signature(s) Signed: 04/03/2019 6:12:06 PM By: Zenaida Deed RN, BSN Signed: 04/06/2019 4:06:15 PM By: Benjaman Kindler EMT/HBOT Previous Signature: 03/31/2019 6:11:25 PM Version By: Zenaida Deed RN, BSN Entered By: Benjaman Kindler on 04/03/2019 11:07:31 -------------------------------------------------------------------------------- Wound Assessment Details Patient Name: Date of Service: MELADY, CHOW 03/31/2019 10:30 AM Medical Record [id number] Patient Account Number: [id number] Date of Birth/Sex: Treating RN: 09-25-1950 (68 y.o. Billy Coast, Linda Primary Care Lubna Stegeman: Feliciana Rossetti Other Clinician: Referring Shianna Bally: Treating Allyssia Skluzacek/Extender:Robson, Sheron Nightingale, Ronette Deter in Treatment: 0 Wound Status Wound Number: 2 Primary Lymphedema Etiology: Wound Location: Left Foot - Dorsal Wound Open Wounding Event: Gradually Appeared Status: Date Acquired: 03/28/2018 Comorbid Cataracts, Deep Vein Thrombosis, Peripheral Weeks Of Treatment: 0 History: Venous Disease, Osteoarthritis, Neuropathy Clustered Wound: No Photos Wound Measurements Length: (cm) 1 Width: (cm) 0.6 Depth: (cm) 0.1 Area: (cm) 0.471 Volume: (cm) 0.047 Wound Description Classification: Partial Thickness Wound Margin: Flat and Intact Exudate Amount: Small Exudate Type: Serosanguineous Exudate Color: red, brown Wound Bed Granulation Amount: Large (67-100%) Granulation Quality: Red Necrotic Amount: None Present (0%) Foul Odor After Cleansing: Slough/Fibrino Exposed Structure Fascia Exposed: Fat Layer (Subcutaneous Tissue) Exposed: Tendon Exposed: Muscle Exposed: Joint Exposed: Bone Exposed: Limited to Skin Breakdown % Reduction in Area: 0% % Reduction in Volume: 0% Epithelialization: Medium (34-66%) Tunneling: No Undermining: No No No No No No No No No Electronic Signature(s) Signed: 04/03/2019 6:12:06 PM By:  Zenaida Deed RN, BSN Signed: 04/06/2019 4:06:15 PM By: Benjaman Kindler EMT/HBOT Previous Signature: 03/31/2019 6:11:25 PM Version By: Zenaida Deed RN, BSN Entered By: Benjaman Kindler on 04/03/2019 11:07:52 -------------------------------------------------------------------------------- Wound Assessment Details Patient Name: Date of Service: GENEEN, DIETER 03/31/2019 10:30 AM Medical Record [id number] Patient Account Number: [id number] Date of Birth/Sex: Treating RN: August 04, 1950 (68 y.o. Billy Coast, Linda Primary Care Abdallah Hern: Feliciana Rossetti Other Clinician: Referring Merary Garguilo: Treating Clairissa Valvano/Extender:Robson, Sheron Nightingale, Ronette Deter in Treatment: 0 Wound Status Wound Number: 3 Primary Lymphedema Etiology: Wound Location: Left Lower Leg - Posterior Wound Open Wounding Event: Gradually Appeared Status: Date Acquired: 03/28/2018 Comorbid Cataracts, Deep Vein Thrombosis, Peripheral Weeks Of Treatment: 0 History: Venous Disease, Osteoarthritis, Neuropathy Clustered Wound: No Photos Wound Measurements Length: (cm) 5.2 % Reduc Width: (cm) 6.5 % Reduc Depth: (cm) 0.1 Epithel Area: (cm) 26.546 Tunnel Volume: (cm) 2.655 Underm Wound Description Classification: Full Thickness Without Exposed Support Foul O Structures Slough Wound Flat and Intact Margin: Exudate Medium Amount: Exudate Serous Type: Exudate amber Color: Wound Bed Granulation Amount: Large (67-100%) Granulation Quality: Red, Hyper-granulation Fascia Necrotic Amount: None Present (0%) Fat La Tendon Muscle Joint Bone E dor After Cleansing: No /Fibrino No Exposed Structure Exposed: No yer (Subcutaneous Tissue) Exposed: Yes Exposed: No Exposed: No Exposed: No xposed: No tion in Area: 0% tion in Volume: 0% ialization: Small (1-33%) ing: No ining: No Electronic Signature(s) Signed: 04/03/2019 6:12:06 PM By: Zenaida Deed RN, BSN Signed: 04/06/2019 4:06:15 PM By: Benjaman Kindler  EMT/HBOT Previous Signature: 03/31/2019 6:11:25 PM Version By: Zenaida Deed RN, BSN Entered By: Benjaman Kindler on  04/03/2019 11:08:19 -------------------------------------------------------------------------------- Wound Assessment Details Patient Name: Date of Service: CHELBY, SALATA 03/31/2019 10:30 AM Medical Record [id number] Patient Account Number: [id number] Date of Birth/Sex: Treating RN: Jan 26, 1951 (68 y.o. Martyn Malay, Linda Primary Care Rozalyn Osland: Gilford Rile Other Clinician: Referring Ruby Dilone: Treating Haleema Vanderheyden/Extender:Robson, Josefina Do, [employer] in Treatment: 0 Wound Status Wound Number: 4 Primary Lymphedema Etiology: Wound Location: Left Lower Leg - Posterior, Distal Wound Open Wounding Event: Gradually Appeared Status: Date Acquired: 03/28/2018 Comorbid Cataracts, Deep Vein Thrombosis, Peripheral Weeks Of Treatment: 0 History: Venous Disease, Osteoarthritis, Neuropathy Clustered Wound: No Photos Wound Measurements Length: (cm) 0.6 % Reduc Width: (cm) 2.5 % Reduc Depth: (cm) 0.1 Epithel Area: (cm) 1.178 Tunnel Volume: (cm) 0.118 Underm Wound Description Classification: Full Thickness Without Exposed Support Foul O Structures Slough Wound Flat and Intact Margin: Exudate Small Amount: Exudate Serous Type: Exudate amber Color: Wound Bed Granulation Amount: Large (67-100%) Granulation Quality: Red, Hyper-granulation Fascia Necrotic Amount: None Present (0%) Fat La Tendon Ex Muscle Ex Joint Exp Bone Expo dor After Cleansing: No /Fibrino No Exposed Structure Exposed: No yer (Subcutaneous Tissue) Exposed: Yes posed: No posed: No osed: No sed: No tion in Area: 0% tion in Volume: 0% ialization: Small (1-33%) ing: No ining: No Electronic Signature(s) Signed: 04/03/2019 6:12:06 PM By: Baruch Gouty RN, BSN Signed: 04/06/2019 4:06:15 PM By: Mikeal Hawthorne EMT/HBOT Previous Signature: 03/31/2019 6:11:25 PM Version By: Baruch Gouty  RN, BSN Entered By: Mikeal Hawthorne on 04/03/2019 11:08:40 -------------------------------------------------------------------------------- Vitals Details Patient Name: Date of Service: JAELENE, GARCIAGARCIA 03/31/2019 10:30 AM Medical Record [id number] Patient Account Number: [id number] Date of Birth/Sex: Treating RN: Sep 19, 1950 (68 y.o. Elam Dutch Primary Care Mimie Goering: Gilford Rile Other Clinician: Referring Sila Sarsfield: Treating Sherel Fennell/Extender:Robson, Josefina Do, [employer] in Treatment: 0 Vital Signs Time Taken: 11:00 Temperature (F): 98.7 Height (in): 58 Pulse (bpm): 87 Source: Stated Respiratory Rate (breaths/min): 18 Weight (lbs): 120 Blood Pressure (mmHg): 107/58 Source: Stated Reference Range: 80 - 120 mg / dl Body Mass Index (BMI): 25.1 Electronic Signature(s) Signed: 03/31/2019 6:11:25 PM By: Baruch Gouty RN, BSN Entered By: Baruch Gouty on 03/31/2019 11:02:31

## 2019-05-06 NOTE — Progress Notes (Signed)
Catherine Wallace, Catherine Wallace ([id number]) Visit Report for 03/31/2019 Chief Complaint Document Details Patient Name: Date of Service: Catherine Wallace, Catherine Wallace 03/31/2019 10:30 AM Medical Record [id number] Patient Account Number: [id number] Date of Birth/Sex: Treating RN: [birth year] (68 y.o. Freddy Finner Primary Care Provider: Feliciana Rossetti Other Clinician: Referring Provider: Treating Provider/Extender:Saveah Bahar, Sheron Nightingale, Ronette Deter in Treatment: 0 Information Obtained from: Patient Chief Complaint 03/31/2019; patient is here for review of wounds on her right lower leg and also asked to look at the skin in the buttocks right greater than left and groin area. Electronic Signature(s) Signed: 03/31/2019 6:03:12 PM By: Baltazar Najjar MD Entered By: Baltazar Najjar on 03/31/2019 13:04:37 -------------------------------------------------------------------------------- HPI Details Patient Name: Date of Service: Catherine Wallace, Catherine Wallace 03/31/2019 10:30 AM Medical Record [id number] Patient Account Number: [id number] Date of Birth/Sex: Treating RN: 19-Jun-1950 (68 y.o. Freddy Finner Primary Care Provider: Feliciana Rossetti Other Clinician: Referring Provider: Treating Provider/Extender:Seana Underwood, Sheron Nightingale, Ronette Deter in Treatment: 0 History of Present Illness HPI Description: ADMISSION 03/31/2019 This is a 68 year old woman who currently is at [facility] skilled facility. She is here predominantly for review of wound on the left posterior calf. The history behind this wound is very difficult to obtain. She says she has had weeping fluid in this area on and off for a year. Then she told me that the wound had only been open for 2 to 3 weeks. On her left lower extremity she has a fairly significant area on the left posterior calf 2 different areas including a major area superiorly and a smaller 1 just above the Achilles area. She has an area on the left lateral foot and the left dorsal foot which are small benign  looking wounds. It looks as though they have been applying Xeroform. Most of her recent records in [place] link seem to have been involving a perineal rash. She has been in and out of hospital for left lower extremity cellulitis apparently in the area of the week quoted above she was actually sent to [facility] burn center and was admitted from 9/23 soon 9/28 where the area itself was biopsied and this was felt to be a symmetrical drug-related intertriginous and flexural exanthema. I think this is a description rather than the diagnosis. I think the culprit medication was felt to be Bactrim. There was felt at this time to be lesions on the face, oral mucosa and genitalia [o Stevens-Johnson syndrome]. In any case she was hospitalized again from 10/7 through 10/13. She was found to have acute renal failure with an elevated CK. Noted to have recurrent falls. While in the hospital she was seen by the wound ostomy nurses who felt she had a scolding skin and they were using Aquacel, Gerhart's Butt cream. She was back in the ER on 10/20 Past medical history; recurrent cellulitis of the legs, PAD, OA of the knees, idiopathic peripheral neuropathy, intertriginous dermatitis, venous insufficiency with a history of ablations in the right leg apparently in 2018 but I have not seen this information, lymphedema. Remote right leg DVT per the patient We could not obtain ABIs on the left leg because of pain. Apparently there is arterial studies quoted in 2018 however this is on care everywhere and we could not open this. Electronic Signature(s) Signed: 03/31/2019 6:03:12 PM By: Baltazar Najjar MD Entered By: Baltazar Najjar on 03/31/2019 13:22:19 -------------------------------------------------------------------------------- Physical Exam Details Patient Name: Date of Service: Catherine Wallace, Catherine Wallace 03/31/2019 10:30 AM Medical Record [id number] Patient Account Number: [id number] Date of Birth/Sex: Treating  RN: [birth year] (68 y.o.  Freddy FinnerF) Epps, Carrie Primary Care Provider: Feliciana RossettiGrisso, Greg Other Clinician: Referring Provider: Treating Provider/Extender:Sheriann Newmann, Sheron NightingaleMichael Grisso, Ronette DeterGreg Weeks in Treatment: 0 Constitutional Sitting or standing Blood Pressure is within target range for patient.. Pulse regular and within target range for patient.Marland Kitchen. Respirations regular, non-labored and within target range.. Temperature is normal and within the target range for the patient.Marland Kitchen. Appears in no distress. Eyes Conjunctivae clear. No discharge.no icterus. Respiratory work of breathing is normal. Bilateral breath sounds are clear and equal in all lobes with no wheezes, rales or rhonchi.. Cardiovascular Heart rhythm and rate regular, without murmur or gallop.. Needle pulses palpable. She definitely has changes of chronic venous insufficiency probably with some degree of dependent edema likely lymphedema. There was no pitting edema. Gastrointestinal (GI) Abdomen is soft and non-distended without masses or tenderness.. No liver or spleen enlargement. Genitourinary (GU) Foley catheter in place. Lymphatic Palpable in the popliteal area. Integumentary (Hair, Skin) Changes of chronic venous insufficiency. Extensive buttock and perineal rash. A lot of the area on the buttocks looked like Candida with satellite lesions.. Neurological Decreased sensation by on the left leg. Psychiatric appears at normal baseline. Notes Wound exam 1. On the left posterior calf there are 2 areas with healthy granulation. Some of this hyper granulated. This is probably related to chronic venous insufficiency with secondary lymphedema. The skin surrounding this was a dusky red color but I do not think this represents cellulitis. There were 2 small open areas on the left lateral foot and left dorsal foot. 2. With some difficulty I looked at some of her buttocks and groin area. This is erythematous scaly with satellite lesions  extensively. Electronic Signature(s) Signed: 03/31/2019 6:03:12 PM By: Baltazar Najjarobson, [place] MD Entered By: Baltazar Najjarobson, Quinn Quam on 03/31/2019 13:25:44 -------------------------------------------------------------------------------- Physician Orders Details Patient Name: Date of Service: Catherine Wallace, Catherine Wallace 03/31/2019 10:30 AM Medical Record [id number] Patient Account Number: [id number] Date of Birth/Sex: Treating RN: 10/07/1950 (68 y.o. Freddy FinnerF) Epps, Carrie Primary Care Provider: Feliciana RossettiGrisso, Greg Other Clinician: Referring Provider: Treating Provider/Extender:Kamry Faraci, Sheron NightingaleMichael Grisso, Ronette DeterGreg Weeks in Treatment: 0 Verbal / Phone Orders: No Diagnosis Coding Follow-up Appointments Return Appointment in 1 week. Dressing Change Frequency Wound #1 Left,Lateral Foot Change Dressing every other day. Wound #2 Left,Dorsal Foot Change Dressing every other day. Wound #3 Left,Posterior Lower Leg Change Dressing every other day. Wound #4 Left,Distal,Posterior Lower Leg Change Dressing every other day. Skin Barriers/Peri-Wound Care Wound #1 Left,Lateral Foot Barrier cream TCA Cream or Ointment - liberally Wound #2 Left,Dorsal Foot Barrier cream TCA Cream or Ointment - liberally Wound #3 Left,Posterior Lower Leg Barrier cream TCA Cream or Ointment - liberally Wound #4 Left,Distal,Posterior Lower Leg Barrier cream TCA Cream or Ointment - liberally Wound Cleansing Wound #1 Left,Lateral Foot Clean wound with Normal Saline. Wound #2 Left,Dorsal Foot Clean wound with Normal Saline. Wound #3 Left,Posterior Lower Leg Clean wound with Normal Saline. Wound #4 Left,Distal,Posterior Lower Leg Clean wound with Normal Saline. Primary Wound Dressing Wound #1 Left,Lateral Foot Hydrofera Blue - CLASSIC, moisten with normal saline Wound #2 Left,Dorsal Foot Hydrofera Blue - CLASSIC, moisten with normal saline Wound #3 Left,Posterior Lower Leg Hydrofera Blue - CLASSIC, moisten with normal saline Wound #4  Left,Distal,Posterior Lower Leg Hydrofera Blue - CLASSIC, moisten with normal saline Secondary Dressing Wound #1 Left,Lateral Foot ABD pad Wound #2 Left,Dorsal Foot ABD pad Wound #3 Left,Posterior Lower Leg ABD pad Wound #4 Left,Distal,Posterior Lower Leg ABD pad Edema Control Wound #1 Left,Lateral Foot 3 Layer Compression System - Left Lower Extremity Wound #2 Left,Dorsal Foot 3 Layer Compression System -  Left Lower Extremity Wound #3 Left,Posterior Lower Leg 3 Layer Compression System - Left Lower Extremity Wound #4 Left,Distal,Posterior Lower Leg 3 Layer Compression System - Left Lower Extremity Electronic Signature(s) Signed: 03/31/2019 6:03:12 PM By: Baltazar Najjar MD Signed: 05/06/2019 12:05:18 PM By: Yevonne Pax RN Entered By: Yevonne Pax on 03/31/2019 12:09:06 -------------------------------------------------------------------------------- Problem List Details Patient Name: Date of Service: Catherine Wallace, Catherine Wallace 03/31/2019 10:30 AM Medical Record [id number] Patient Account Number: [id number] Date of Birth/Sex: Treating RN: 02/14/1951 (68 y.o. Freddy Finner Primary Care Provider: Feliciana Rossetti Other Clinician: Referring Provider: Treating Provider/Extender:Laqueena Hinchey, Sheron Nightingale, Ronette Deter in Treatment: 0 Active Problems ICD-10 Evaluated Encounter Code Description Active Date Today Diagnosis L97.221 Non-pressure chronic ulcer of left calf limited to 03/31/2019 No Yes breakdown of skin L97.521 Non-pressure chronic ulcer of other part of left foot 03/31/2019 No Yes limited to breakdown of skin I87.331 Chronic venous hypertension (idiopathic) with ulcer 03/31/2019 No Yes and inflammation of right lower extremity I89.0 Lymphedema, not elsewhere classified 03/31/2019 No Yes B37.2 Candidiasis of skin and nail 03/31/2019 No Yes Inactive Problems Resolved Problems Electronic Signature(s) Signed: 03/31/2019 6:03:12 PM By: Baltazar Najjar MD Entered By: Baltazar Najjar on  03/31/2019 13:15:36 -------------------------------------------------------------------------------- Progress Note Details Patient Name: Date of Service: Catherine Wallace, Catherine Wallace 03/31/2019 10:30 AM Medical Record [id number] Patient Account Number: [id number] Date of Birth/Sex: Treating RN: [birth year] (68 y.o. Freddy Finner Primary Care Provider: Feliciana Rossetti Other Clinician: Referring Provider: Treating Provider/Extender:Luisfernando Brightwell, Sheron Nightingale, Ronette Deter in Treatment: 0 Subjective Chief Complaint Information obtained from Patient 03/31/2019; patient is here for review of wounds on her right lower leg and also asked to look at the skin in the buttocks right greater than left and groin area. History of Present Illness (HPI) ADMISSION 03/31/2019 This is a 68 year old woman who currently is at [facility] skilled facility. She is here predominantly for review of wound on the left posterior calf. The history behind this wound is very difficult to obtain. She says she has had weeping fluid in this area on and off for a year. Then she told me that the wound had only been open for 2 to 3 weeks. On her left lower extremity she has a fairly significant area on the left posterior calf 2 different areas including a major area superiorly and a smaller 1 just above the Achilles area. She has an area on the left lateral foot and the left dorsal foot which are small benign looking wounds. It looks as though they have been applying Xeroform. Most of her recent records in [place] link seem to have been involving a perineal rash. She has been in and out of hospital for left lower extremity cellulitis apparently in the area of the week quoted above she was actually sent to [facility] burn center and was admitted from 9/23 soon 9/28 where the area itself was biopsied and this was felt to be a symmetrical drug-related intertriginous and flexural exanthema. I think this is a description rather than  the diagnosis. I think the culprit medication was felt to be Bactrim. There was felt at this time to be lesions on the face, oral mucosa and genitalia [o Stevens-Johnson syndrome]. In any case she was hospitalized again from 10/7 through 10/13. She was found to have acute renal failure with an elevated CK. Noted to have recurrent falls. While in the hospital she was seen by the wound ostomy nurses who felt she had a scolding skin and they were using Aquacel, Gerhart's Butt cream. She was back in  the ER on 10/20 Past medical history; recurrent cellulitis of the legs, PAD, OA of the knees, idiopathic peripheral neuropathy, intertriginous dermatitis, venous insufficiency with a history of ablations in the right leg apparently in 2018 but I have not seen this information, lymphedema. Remote right leg DVT per the patient We could not obtain ABIs on the left leg because of pain. Apparently there is arterial studies quoted in 2018 however this is on care everywhere and we could not open this. Patient History Information obtained from Patient. Allergies Sulfa (Sulfonamide Antibiotics) (Severity: Severe, Reaction: rash), penicillin (Severity: Moderate, Reaction: nausea) Family History Cancer - Mother,Father, Heart Disease - Maternal Grandparents, Stroke - Maternal Grandparents, Thyroid Problems - Mother, No family history of Diabetes, Hereditary Spherocytosis, Hypertension, Kidney Disease, Lung Disease, Seizures, Tuberculosis. Social History Current some day smoker - not smoking at facility, Marital Status - Widowed, Alcohol Use - Never, Drug Use - No History, Caffeine Use - Moderate - tea. Medical History Eyes Patient has history of Cataracts Denies history of Glaucoma, Optic Neuritis Ear/Nose/Mouth/Throat Denies history of Chronic sinus problems/congestion, Middle ear problems Cardiovascular Patient has history of Deep Vein Thrombosis - right leg, Peripheral Venous Disease Endocrine Denies  history of Type I Diabetes, Type II Diabetes Musculoskeletal Patient has history of Osteoarthritis Neurologic Patient has history of Neuropathy - polyneuropathy Oncologic Denies history of Received Chemotherapy, Received Radiation Psychiatric Denies history of Anorexia/bulimia, Confinement Anxiety Hospitalization/Surgery History - lumbar surgery. - tonsillectomy. - carpal tunnel surgery. - tubal ligation. - c-section. Medical And Surgical History Notes Endocrine hypothyroidism Genitourinary overactive bladder Integumentary (Skin) intertriginous, flexeral exanthema Neurologic spinal stenosis Psychiatric depression Review of Systems (ROS) Constitutional Symptoms (General Health) Complains or has symptoms of Fever - intermittant. Eyes Complains or has symptoms of Glasses / Contacts. Ear/Nose/Mouth/Throat Denies complaints or symptoms of Chronic sinus problems or rhinitis. Respiratory Denies complaints or symptoms of Chronic or frequent coughs, Shortness of Breath. Cardiovascular Denies complaints or symptoms of Chest pain. Gastrointestinal Denies complaints or symptoms of Frequent diarrhea, Nausea, Vomiting. Integumentary (Skin) Complains or has symptoms of Wounds - left lower leg. Musculoskeletal Denies complaints or symptoms of Muscle Pain, Muscle Weakness. Neurologic Denies complaints or symptoms of Numbness/parasthesias. Psychiatric Denies complaints or symptoms of Claustrophobia, Suicidal. Objective Constitutional Sitting or standing Blood Pressure is within target range for patient.. Pulse regular and within target range for patient.Marland Kitchen Respirations regular, non-labored and within target range.. Temperature is normal and within the target range for the patient.Marland Kitchen Appears in no distress. Vitals Time Taken: 11:00 AM, Height: 58 in, Source: Stated, Weight: 120 lbs, Source: Stated, BMI: 25.1, Temperature: 98.7 F, Pulse: 87 bpm, Respiratory Rate: 18 breaths/min, Blood  Pressure: 107/58 mmHg. Eyes Conjunctivae clear. No discharge.no icterus. Respiratory work of breathing is normal. Bilateral breath sounds are clear and equal in all lobes with no wheezes, rales or rhonchi.. Cardiovascular Heart rhythm and rate regular, without murmur or gallop.. Needle pulses palpable. She definitely has changes of chronic venous insufficiency probably with some degree of dependent edema likely lymphedema. There was no pitting edema. Gastrointestinal (GI) Abdomen is soft and non-distended without masses or tenderness.. No liver or spleen enlargement. Genitourinary (GU) Foley catheter in place. Lymphatic Palpable in the popliteal area. Neurological Decreased sensation by on the left leg. Psychiatric appears at normal baseline. General Notes: Wound exam 1. On the left posterior calf there are 2 areas with healthy granulation. Some of this hyper granulated. This is probably related to chronic venous insufficiency with secondary lymphedema. The skin surrounding this was a  dusky red color but I do not think this represents cellulitis. There were 2 small open areas on the left lateral foot and left dorsal foot. 2. With some difficulty I looked at some of her buttocks and groin area. This is erythematous scaly with satellite lesions extensively. Integumentary (Hair, Skin) Changes of chronic venous insufficiency. Extensive buttock and perineal rash. A lot of the area on the buttocks looked like Candida with satellite lesions.. Wound #1 status is Open. Original cause of wound was Gradually Appeared. The wound is located on the Left,Lateral Foot. The wound measures 1.3cm length x 1.2cm width x 0.1cm depth; 1.225cm^2 area and 0.123cm^3 volume. There is Fat Layer (Subcutaneous Tissue) Exposed exposed. There is no tunneling or undermining noted. There is a small amount of serous drainage noted. The wound margin is flat and intact. There is large (67-100%) red granulation within  the wound bed. There is no necrotic tissue within the wound bed. Wound #2 status is Open. Original cause of wound was Gradually Appeared. The wound is located on the Left,Dorsal Foot. The wound measures 1cm length x 0.6cm width x 0.1cm depth; 0.471cm^2 area and 0.047cm^3 volume. The wound is limited to skin breakdown. There is no tunneling or undermining noted. There is a small amount of serosanguineous drainage noted. The wound margin is flat and intact. There is large (67-100%) red granulation within the wound bed. There is no necrotic tissue within the wound bed. Wound #3 status is Open. Original cause of wound was Gradually Appeared. The wound is located on the Left,Posterior Lower Leg. The wound measures 5.2cm length x 6.5cm width x 0.1cm depth; 26.546cm^2 area and 2.655cm^3 volume. There is Fat Layer (Subcutaneous Tissue) Exposed exposed. There is no tunneling or undermining noted. There is a medium amount of serous drainage noted. The wound margin is flat and intact. There is large (67-100%) red, hyper - granulation within the wound bed. There is no necrotic tissue within the wound bed. Wound #4 status is Open. Original cause of wound was Gradually Appeared. The wound is located on the Left,Distal,Posterior Lower Leg. The wound measures 0.6cm length x 2.5cm width x 0.1cm depth; 1.178cm^2 area and 0.118cm^3 volume. There is Fat Layer (Subcutaneous Tissue) Exposed exposed. There is no tunneling or undermining noted. There is a small amount of serous drainage noted. The wound margin is flat and intact. There is large (67-100%) red, hyper - granulation within the wound bed. There is no necrotic tissue within the wound bed. 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 There was a Three Layer Compression Therapy Procedure by Carlene Coria, RN. Post procedure Diagnosis Wound #1: Same as Pre-Procedure Wound #2 Pre-procedure diagnosis of Wound #2 is a Lymphedema located on the Left,Dorsal Foot . There was a Three Layer Compression Therapy Procedure by Carlene Coria, RN. Post procedure Diagnosis Wound #2: Same as Pre-Procedure Wound #3 Pre-procedure diagnosis of Wound #3 is a Lymphedema located on the Left,Posterior Lower Leg . There was a Three Layer Compression Therapy Procedure by Carlene Coria, RN. Post procedure Diagnosis Wound #3: Same as Pre-Procedure Wound #4 Pre-procedure diagnosis of Wound #4 is a Lymphedema located on the Left,Distal,Posterior  Lower Leg . There was a Three Layer Compression Therapy Procedure by Yevonne Pax, RN. Post procedure Diagnosis Wound #4: Same as Pre-Procedure Plan Follow-up Appointments: Return Appointment in 1 week. Dressing Change Frequency: Wound #1 Left,Lateral Foot: Change Dressing every other day. Wound #2 Left,Dorsal Foot: Change Dressing every other day. Wound #3 Left,Posterior Lower Leg: Change Dressing every other day. Wound #4 Left,Distal,Posterior Lower Leg: Change Dressing every other day. Skin Barriers/Peri-Wound Care: Wound #1 Left,Lateral Foot: Barrier cream TCA Cream or Ointment - liberally Wound #2 Left,Dorsal Foot: Barrier cream TCA Cream or Ointment - liberally Wound #3 Left,Posterior Lower Leg: Barrier cream TCA Cream or Ointment - liberally Wound #4 Left,Distal,Posterior Lower Leg: Barrier cream TCA Cream or Ointment - liberally Wound Cleansing: Wound #1 Left,Lateral Foot: Clean wound with Normal Saline. Wound #2 Left,Dorsal Foot: Clean wound with Normal Saline. Wound #3 Left,Posterior Lower  Leg: Clean wound with Normal Saline. Wound #4 Left,Distal,Posterior Lower Leg: Clean wound with Normal Saline. Primary Wound Dressing: Wound #1 Left,Lateral Foot: Hydrofera Blue - CLASSIC, moisten with normal saline Wound #2 Left,Dorsal Foot: Hydrofera Blue - CLASSIC, moisten with normal saline Wound #3 Left,Posterior Lower Leg: Hydrofera Blue - CLASSIC, moisten with normal saline Wound #4 Left,Distal,Posterior Lower Leg: Hydrofera Blue - CLASSIC, moisten with normal saline Secondary Dressing: Wound #1 Left,Lateral Foot: ABD pad Wound #2 Left,Dorsal Foot: ABD pad Wound #3 Left,Posterior Lower Leg: ABD pad Wound #4 Left,Distal,Posterior Lower Leg: ABD pad Edema Control: Wound #1 Left,Lateral Foot: 3 Layer Compression System - Left Lower Extremity Wound #2 Left,Dorsal Foot: 3 Layer Compression System - Left Lower Extremity Wound #3 Left,Posterior Lower Leg: 3 Layer Compression System - Left Lower Extremity Wound #4 Left,Distal,Posterior Lower Leg: 3 Layer Compression System - Left Lower Extremity 1. The left leg wound is probably related to chronic venous hypertension and lymphedema. She states that she has had weeping fluid in this area in the past. She has a history of venous ablation on the right leg. We put Hydrofera Blue on this because of the hyper granulation under 3 layer compression 2. Although her ABIs were none obtainable in our clinic because of discomfort her pulses in the foot were easily palpable in the foot was warm. I therefore felt comfortable with 3 layers 3. The nursing home will change this every second day 4. The extensive rash in her buttocks and groin area is a bit difficult to follow historically. The biopsy suggested marked epidermal spongiosis and dermal edema with necrotic keratinocytes and dense inflammation with numerous eosinophils. I think for this reason it was felt to be a drug eruption possibly Bactrim. However a large part of this looks like a  candidal rash that I have seen many times in nursing home patients. I would actually treat this with oral antifungals and topical ketoconazole and follow the clinical response. 5. We will see her back in 1 week predominantly to follow the left calf. 6. The patient lives in [place]. She lives alone although does some have some help from his son. She was supposed to be discharged at [facility] although she is appealing this Psychologist, prison and probation services) Signed: 03/31/2019 6:03:12 PM By: Baltazar Najjar MD Entered By: Baltazar Najjar on 03/31/2019 13:28:38 -------------------------------------------------------------------------------- HxROS Details Patient Name: Date of Service: Catherine Wallace, Catherine Wallace 03/31/2019 10:30 AM Medical Record [id number] Patient Account Number: [id number] Date of Birth/Sex: Treating RN: February 05, 1951 (68 y.o. Catherine Wallace Primary Care Provider: Feliciana Rossetti Other Clinician: Referring Provider: Treating Provider/Extender:Visente Kirker, Sheron Nightingale, Ronette Deter in Treatment: 0 Information Obtained From Patient  Constitutional Symptoms (General Health) Complaints and Symptoms: Positive for: Fever - intermittant Eyes Complaints and Symptoms: Positive for: Glasses / Contacts Medical History: Positive for: Cataracts Negative for: Glaucoma; Optic Neuritis Ear/Nose/Mouth/Throat Complaints and Symptoms: Negative for: Chronic sinus problems or rhinitis Medical History: Negative for: Chronic sinus problems/congestion; Middle ear problems Respiratory Complaints and Symptoms: Negative for: Chronic or frequent coughs; Shortness of Breath Cardiovascular Complaints and Symptoms: Negative for: Chest pain Medical History: Positive for: Deep Vein Thrombosis - right leg; Peripheral Venous Disease Gastrointestinal Complaints and Symptoms: Negative for: Frequent diarrhea; Nausea; Vomiting Integumentary (Skin) Complaints and Symptoms: Positive for: Wounds - left lower leg Medical  History: Past Medical History Notes: intertriginous, flexeral exanthema Musculoskeletal Complaints and Symptoms: Negative for: Muscle Pain; Muscle Weakness Medical History: Positive for: Osteoarthritis Neurologic Complaints and Symptoms: Negative for: Numbness/parasthesias Medical History: Positive for: Neuropathy - polyneuropathy Past Medical History Notes: spinal stenosis Psychiatric Complaints and Symptoms: Negative for: Claustrophobia; Suicidal Medical History: Negative for: Anorexia/bulimia; Confinement Anxiety Past Medical History Notes: depression Hematologic/Lymphatic Endocrine Medical History: Negative for: Type I Diabetes; Type II Diabetes Past Medical History Notes: hypothyroidism Genitourinary Medical History: Past Medical History Notes: overactive bladder Immunological Oncologic Medical History: Negative for: Received Chemotherapy; Received Radiation HBO Extended History Items Eyes: Cataracts Immunizations Pneumococcal Vaccine: Received Pneumococcal Vaccination: Yes Implantable Devices None Hospitalization / Surgery History Type of Hospitalization/Surgery lumbar surgery tonsillectomy carpal tunnel surgery tubal ligation c-section Family and Social History Cancer: Yes - Mother,Father; Diabetes: No; Heart Disease: Yes - Maternal Grandparents; Hereditary Spherocytosis: No; Hypertension: No; Kidney Disease: No; Lung Disease: No; Seizures: No; Stroke: Yes - Maternal Grandparents; Thyroid Problems: Yes - Mother; Tuberculosis: No; Current some day smoker - not smoking at facility; Marital Status - Widowed; Alcohol Use: Never; Drug Use: No History; Caffeine Use: Moderate - tea; Financial Concerns: No; Food, Clothing or Shelter Needs: No; Support System Lacking: No; Transportation Concerns: No Psychologist, prison and probation services) Signed: 03/31/2019 6:03:12 PM By: Baltazar Najjar MD Signed: 03/31/2019 6:11:25 PM By: Zenaida Deed RN, BSN Entered By: Zenaida Deed  on 03/31/2019 11:19:10 -------------------------------------------------------------------------------- SuperBill Details Patient Name: Date of Service: Catherine Wallace, Catherine Wallace 03/31/2019 Medical Record [id number] Patient Account Number: [id number] Date of Birth/Sex: Treating RN: 1951-01-15 (68 y.o. Freddy Finner Primary Care Provider: Feliciana Rossetti Other Clinician: Referring Provider: Treating Provider/Extender:Tidus Upchurch, Sheron Nightingale, Ronette Deter in Treatment: 0 Diagnosis Coding ICD-10 Codes Code Description [phone] Non-pressure chronic ulcer of left calf limited to breakdown of skin L97.521 Non-pressure chronic ulcer of other part of left foot limited to breakdown of skin I87.331 Chronic venous hypertension (idiopathic) with ulcer and inflammation of right lower extremity I89.0 Lymphedema, not elsewhere classified B37.2 Candidiasis of skin and nail Facility Procedures CPT4 Code: [id number] Description: 99213 - WOUND CARE VISIT-LEV 3 EST PT Modifier: 25 Quantity: 1 Physician Procedures CPT4 Code Description: [id number] [id number] - WC PHYS LEVEL 4 - NEW PT ICD-10 Diagnosis Description L97.221 Non-pressure chronic ulcer of left calf limited to breakd L97.521 Non-pressure chronic ulcer of other part of left foot lim I87.331 Chronic venous  hypertension (idiopathic) with ulcer and i lower extremity B37.2 Candidiasis of skin and nail Modifier: own of skin ited to breakdow nflammation of r Quantity: 1 n of skin ight Electronic Signature(s) Signed: 03/31/2019 6:03:12 PM By: Baltazar Najjar MD Signed: 05/06/2019 12:05:18 PM By: Yevonne Pax RN Entered By: Yevonne Pax on 03/31/2019 16:36:56

## 2021-04-04 IMAGING — DX DG WRIST COMPLETE 3+V*L*
4 series · 4 of 4 positions shown · non-contrast
Comparison: Forearm series today

CLINICAL DATA: Fall

EXAM:
LEFT WRIST - COMPLETE 3+ VIEW

[wrist pa]
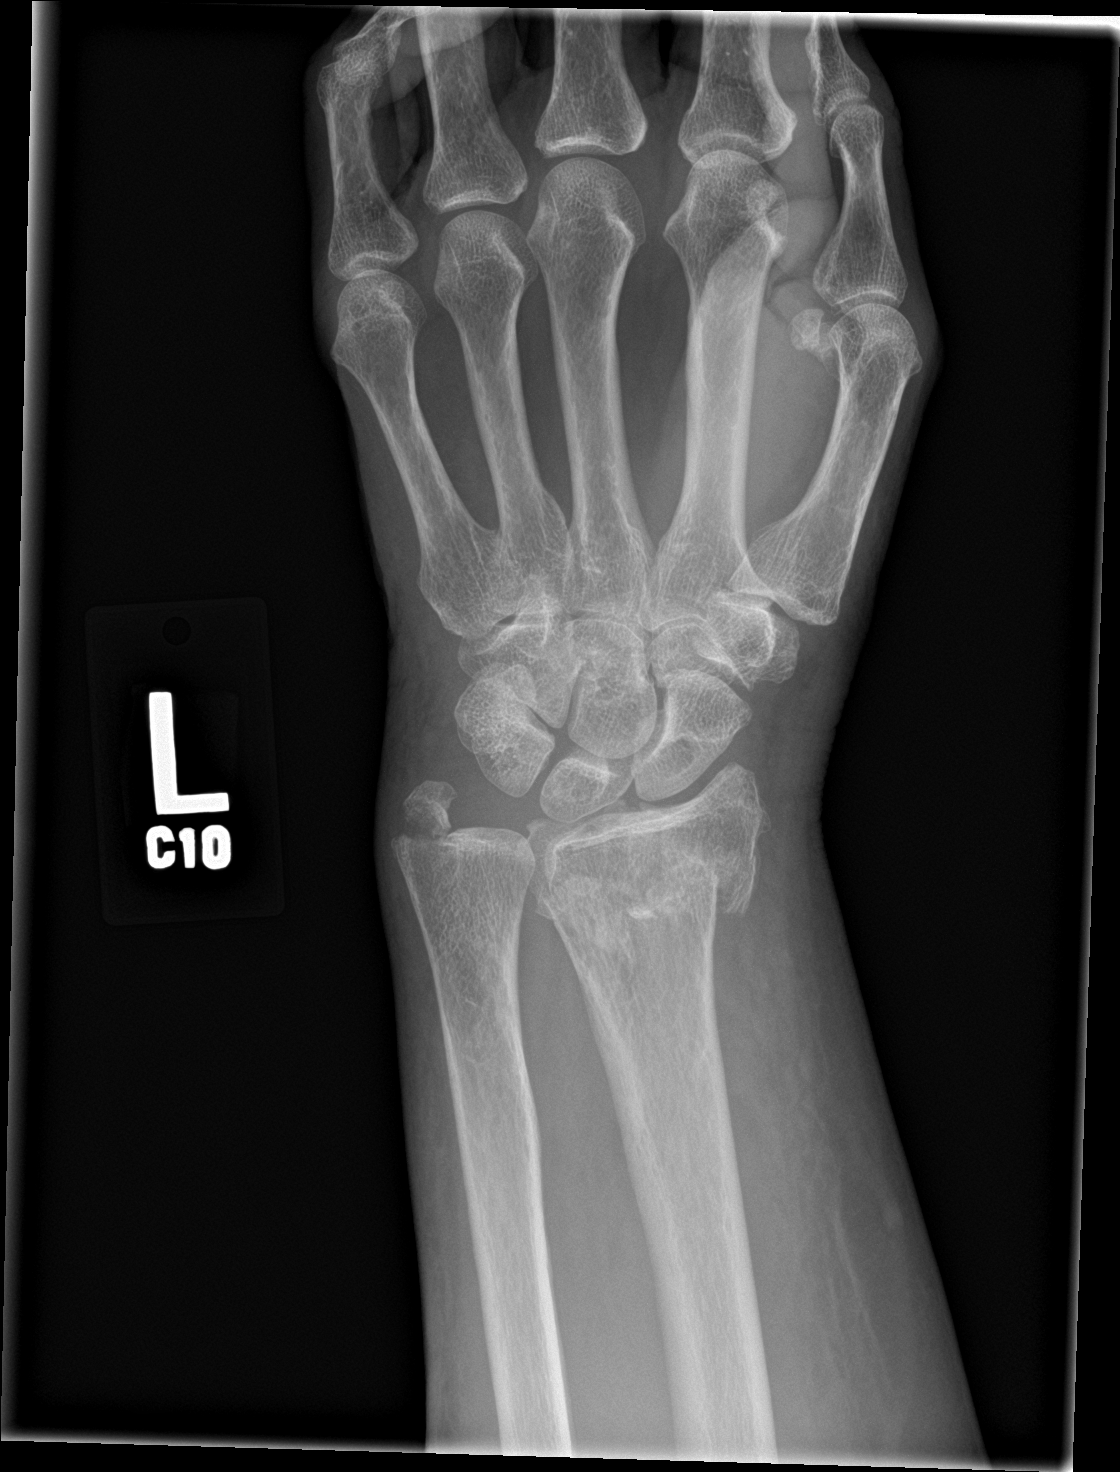

[wrist obl]
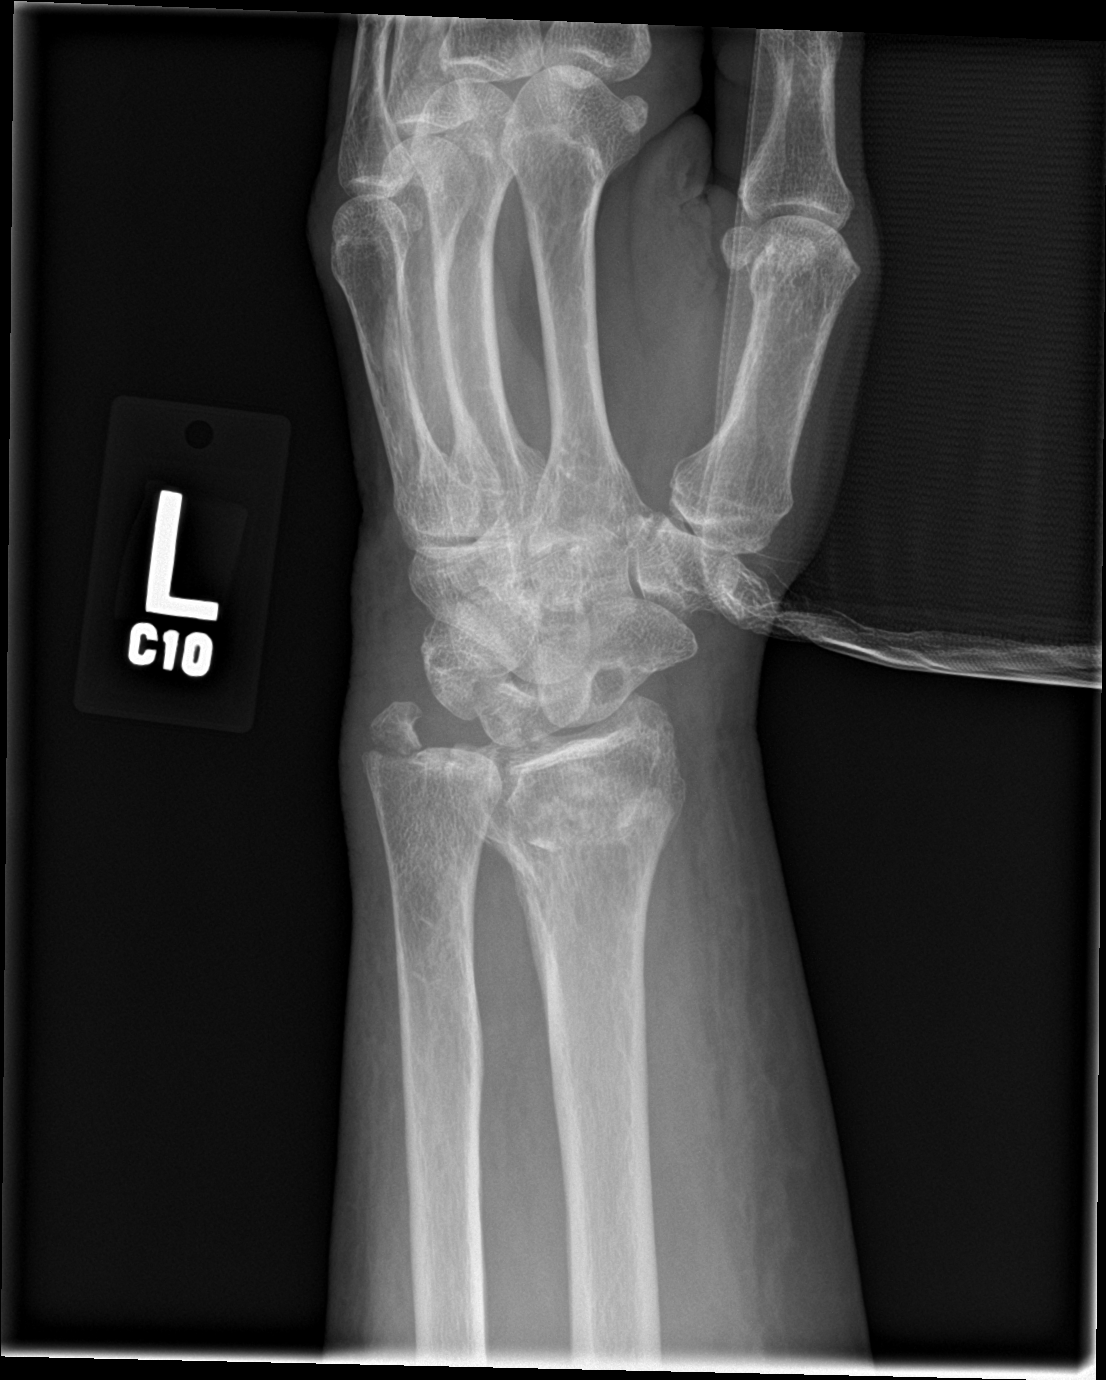

[wrist lat]
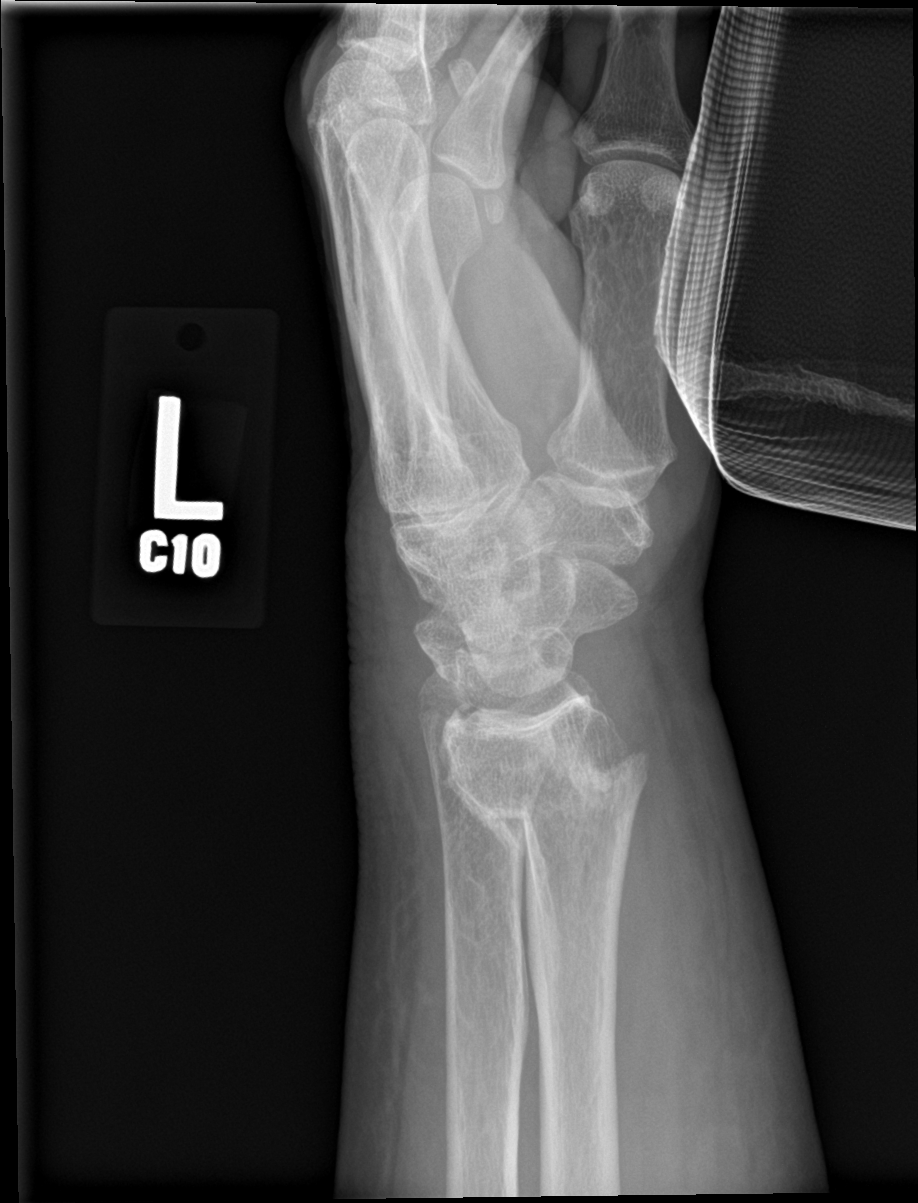

[wrist navicular]
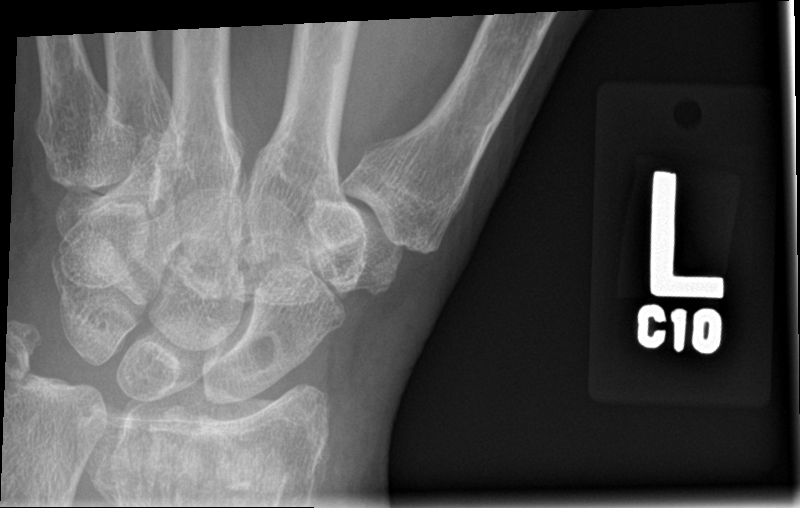

[4 of 4 positions shown; findings below may reference images not displayed]

FINDINGS: There is a distal left radial fracture. Posterior fragments are
displaced and angulated posteriorly. No subluxation or dislocation.
Minimally displaced ulnar styloid fracture.
IMPRESSION: Displaced and angulated distal left radial fracture. Ulnar styloid
fracture

## 2021-04-04 IMAGING — DX DG HUMERUS 2V *L*
2 series · 2 of 2 positions shown · non-contrast
Comparison: None.

CLINICAL DATA: Fall

EXAM:
LEFT HUMERUS - 2+ VIEW

[humerus lat]
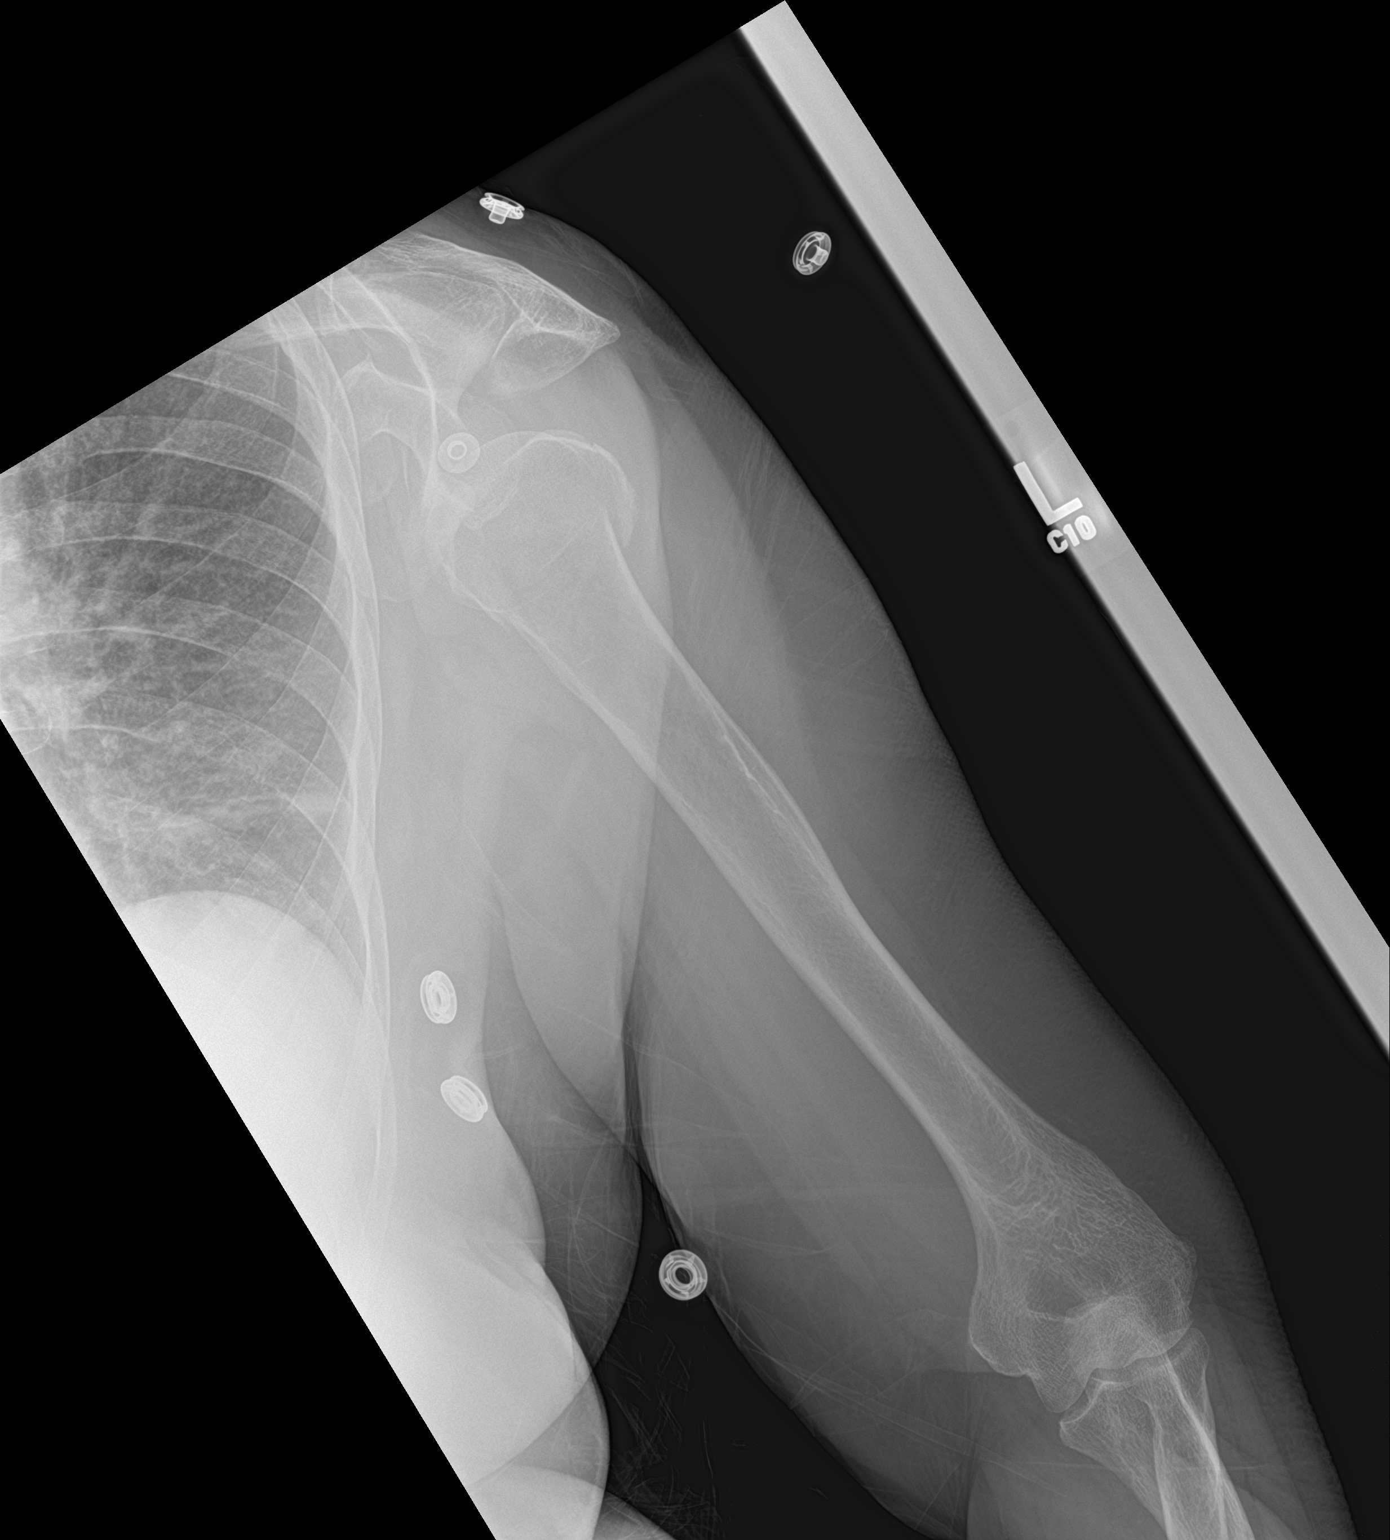

[humerus ap]
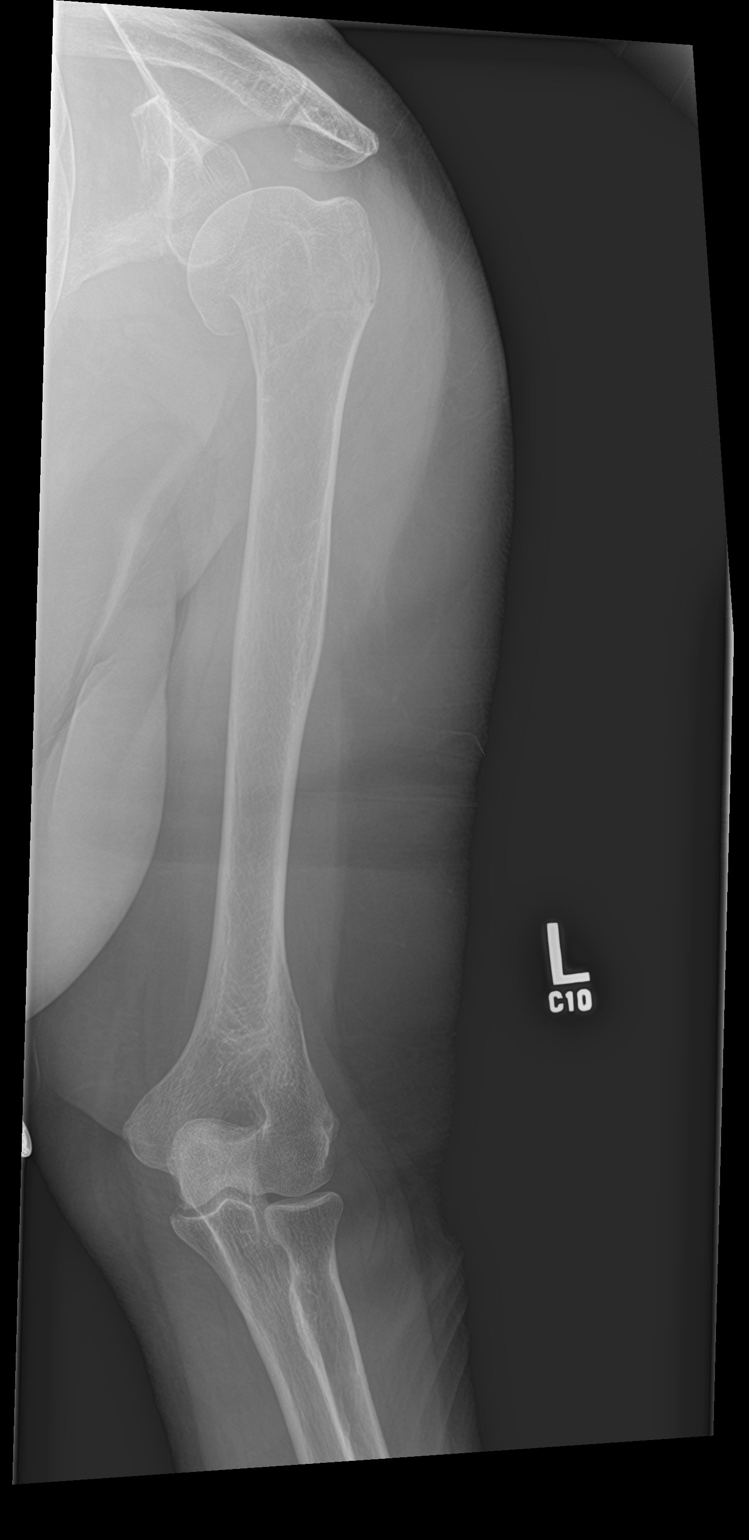

[2 of 2 positions shown; findings below may reference images not displayed]

FINDINGS: There is a left humeral neck fracture noted. Inferior subluxation
of the humeral head. Soft tissues are intact. No additional acute
bony abnormality.
IMPRESSION: Left humeral neck fracture. Inferior subluxation of the humeral
head.

## 2022-11-19 ENCOUNTER — Other Ambulatory Visit: Payer: Self-pay

## 2022-11-19 ENCOUNTER — Emergency Department (HOSPITAL_COMMUNITY): Payer: Medicare PPO

## 2022-11-19 ENCOUNTER — Inpatient Hospital Stay (HOSPITAL_COMMUNITY)
Admission: EM | Admit: 2022-11-19 | Discharge: 2022-11-24 | DRG: 871 | Disposition: A | Discharge status: Home / Self Care | Payer: Medicare PPO | Attending: Internal Medicine | Admitting: Internal Medicine

## 2022-11-19 DIAGNOSIS — Z880 Allergy status to penicillin: Secondary | ICD-10-CM | POA: Diagnosis not present

## 2022-11-19 DIAGNOSIS — F17200 Nicotine dependence, unspecified, uncomplicated: Secondary | ICD-10-CM | POA: Diagnosis present

## 2022-11-19 DIAGNOSIS — Z6832 Body mass index (BMI) 32.0-32.9, adult: Secondary | ICD-10-CM

## 2022-11-19 DIAGNOSIS — Z881 Allergy status to other antibiotic agents status: Secondary | ICD-10-CM | POA: Diagnosis not present

## 2022-11-19 DIAGNOSIS — X58XXXA Exposure to other specified factors, initial encounter: Secondary | ICD-10-CM | POA: Diagnosis present

## 2022-11-19 DIAGNOSIS — L03116 Cellulitis of left lower limb: Secondary | ICD-10-CM | POA: Diagnosis present

## 2022-11-19 DIAGNOSIS — G8929 Other chronic pain: Secondary | ICD-10-CM | POA: Diagnosis present

## 2022-11-19 DIAGNOSIS — E860 Dehydration: Secondary | ICD-10-CM | POA: Diagnosis present

## 2022-11-19 DIAGNOSIS — T361X5A Adverse effect of cephalosporins and other beta-lactam antibiotics, initial encounter: Secondary | ICD-10-CM | POA: Diagnosis present

## 2022-11-19 DIAGNOSIS — R6521 Severe sepsis with septic shock: Secondary | ICD-10-CM | POA: Diagnosis present

## 2022-11-19 DIAGNOSIS — G3184 Mild cognitive impairment, so stated: Secondary | ICD-10-CM | POA: Diagnosis present

## 2022-11-19 DIAGNOSIS — A419 Sepsis, unspecified organism: Principal | ICD-10-CM

## 2022-11-19 DIAGNOSIS — N179 Acute kidney failure, unspecified: Secondary | ICD-10-CM

## 2022-11-19 DIAGNOSIS — I890 Lymphedema, not elsewhere classified: Secondary | ICD-10-CM | POA: Diagnosis not present

## 2022-11-19 DIAGNOSIS — J42 Unspecified chronic bronchitis: Secondary | ICD-10-CM | POA: Diagnosis not present

## 2022-11-19 DIAGNOSIS — Z716 Tobacco abuse counseling: Secondary | ICD-10-CM

## 2022-11-19 DIAGNOSIS — L309 Dermatitis, unspecified: Secondary | ICD-10-CM | POA: Diagnosis present

## 2022-11-19 DIAGNOSIS — E039 Hypothyroidism, unspecified: Secondary | ICD-10-CM

## 2022-11-19 DIAGNOSIS — J9611 Chronic respiratory failure with hypoxia: Secondary | ICD-10-CM | POA: Diagnosis present

## 2022-11-19 DIAGNOSIS — Z720 Tobacco use: Secondary | ICD-10-CM

## 2022-11-19 DIAGNOSIS — E872 Acidosis, unspecified: Secondary | ICD-10-CM | POA: Diagnosis present

## 2022-11-19 DIAGNOSIS — Z79890 Hormone replacement therapy (postmenopausal): Secondary | ICD-10-CM

## 2022-11-19 DIAGNOSIS — Z882 Allergy status to sulfonamides status: Secondary | ICD-10-CM

## 2022-11-19 DIAGNOSIS — J449 Chronic obstructive pulmonary disease, unspecified: Secondary | ICD-10-CM | POA: Diagnosis present

## 2022-11-19 DIAGNOSIS — Z8249 Family history of ischemic heart disease and other diseases of the circulatory system: Secondary | ICD-10-CM

## 2022-11-19 DIAGNOSIS — Z79899 Other long term (current) drug therapy: Secondary | ICD-10-CM

## 2022-11-19 DIAGNOSIS — I872 Venous insufficiency (chronic) (peripheral): Secondary | ICD-10-CM

## 2022-11-19 DIAGNOSIS — R32 Unspecified urinary incontinence: Secondary | ICD-10-CM | POA: Diagnosis present

## 2022-11-19 DIAGNOSIS — E669 Obesity, unspecified: Secondary | ICD-10-CM | POA: Diagnosis present

## 2022-11-19 DIAGNOSIS — Z885 Allergy status to narcotic agent status: Secondary | ICD-10-CM

## 2022-11-19 DIAGNOSIS — R509 Fever, unspecified: Secondary | ICD-10-CM | POA: Diagnosis not present

## 2022-11-19 DIAGNOSIS — Z66 Do not resuscitate: Secondary | ICD-10-CM | POA: Diagnosis present

## 2022-11-19 DIAGNOSIS — L270 Generalized skin eruption due to drugs and medicaments taken internally: Secondary | ICD-10-CM | POA: Diagnosis present

## 2022-11-19 DIAGNOSIS — L0390 Cellulitis, unspecified: Secondary | ICD-10-CM | POA: Diagnosis not present

## 2022-11-19 DIAGNOSIS — M549 Dorsalgia, unspecified: Secondary | ICD-10-CM | POA: Diagnosis present

## 2022-11-19 DIAGNOSIS — R053 Chronic cough: Secondary | ICD-10-CM | POA: Diagnosis present

## 2022-11-19 DIAGNOSIS — M7989 Other specified soft tissue disorders: Secondary | ICD-10-CM | POA: Diagnosis not present

## 2022-11-19 DIAGNOSIS — Z91048 Other nonmedicinal substance allergy status: Secondary | ICD-10-CM

## 2022-11-19 DIAGNOSIS — R21 Rash and other nonspecific skin eruption: Secondary | ICD-10-CM | POA: Diagnosis not present

## 2022-11-19 LAB — CBC WITH DIFFERENTIAL/PLATELET
Abs Immature Granulocytes: 0.06 10*3/uL (ref 0.00–0.07)
Basophils Absolute: 0 10*3/uL (ref 0.0–0.1)
Basophils Relative: 0 %
Eosinophils Absolute: 0.2 10*3/uL (ref 0.0–0.5)
Eosinophils Relative: 2 %
HCT: 43.2 % (ref 36.0–46.0)
Hemoglobin: 14.4 g/dL (ref 12.0–15.0)
Immature Granulocytes: 1 %
Lymphocytes Relative: 7 %
Lymphs Abs: 0.7 10*3/uL (ref 0.7–4.0)
MCH: 30.9 pg (ref 26.0–34.0)
MCHC: 33.3 g/dL (ref 30.0–36.0)
MCV: 92.7 fL (ref 80.0–100.0)
Monocytes Absolute: 0.5 10*3/uL (ref 0.1–1.0)
Monocytes Relative: 5 %
Neutro Abs: 8.3 10*3/uL — ABNORMAL HIGH (ref 1.7–7.7)
Neutrophils Relative %: 85 %
Platelets: 212 10*3/uL (ref 150–400)
RBC: 4.66 MIL/uL (ref 3.87–5.11)
RDW: 12.6 % (ref 11.5–15.5)
WBC: 9.7 10*3/uL (ref 4.0–10.5)
nRBC: 0 % (ref 0.0–0.2)

## 2022-11-19 LAB — LACTIC ACID, PLASMA
Lactic Acid, Venous: 3.1 mmol/L (ref 0.5–1.9)
Lactic Acid, Venous: 2.5 mmol/L (ref 0.5–1.9)

## 2022-11-19 LAB — COMPREHENSIVE METABOLIC PANEL
ALT: 14 U/L (ref 0–44)
AST: 30 U/L (ref 15–41)
Albumin: 3.3 g/dL — ABNORMAL LOW (ref 3.5–5.0)
Alkaline Phosphatase: 107 U/L (ref 38–126)
Anion gap: 16 — ABNORMAL HIGH (ref 5–15)
BUN: 26 mg/dL — ABNORMAL HIGH (ref 8–23)
CO2: 19 mmol/L — ABNORMAL LOW (ref 22–32)
Calcium: 9.4 mg/dL (ref 8.9–10.3)
Chloride: 98 mmol/L (ref 98–111)
Creatinine, Ser: 1.44 mg/dL — ABNORMAL HIGH (ref 0.44–1.00)
GFR, Estimated: 38 mL/min — ABNORMAL LOW (ref >60)
Glucose, Bld: 161 mg/dL — ABNORMAL HIGH (ref 70–99)
Potassium: 4.4 mmol/L (ref 3.5–5.1)
Sodium: 133 mmol/L — ABNORMAL LOW (ref 135–145)
Total Bilirubin: 0.5 mg/dL (ref 0.3–1.2)
Total Protein: 6.6 g/dL (ref 6.5–8.1)

## 2022-11-19 LAB — URINALYSIS, ROUTINE W REFLEX MICROSCOPIC
Glucose, UA: NEGATIVE mg/dL
Hgb urine dipstick: NEGATIVE
Ketones, ur: NEGATIVE mg/dL
Nitrite: NEGATIVE
Protein, ur: NEGATIVE mg/dL
Specific Gravity, Urine: >1.03 — ABNORMAL HIGH (ref 1.005–1.030)
pH: 6 (ref 5.0–8.0)

## 2022-11-19 LAB — I-STAT VENOUS BLOOD GAS, ED
Acid-Base Excess: 2 mmol/L (ref 0.0–2.0)
Bicarbonate: 27 mmol/L (ref 20.0–28.0)
Calcium, Ion: 1.1 mmol/L — ABNORMAL LOW (ref 1.15–1.40)
HCT: 39 % (ref 36.0–46.0)
Hemoglobin: 13.3 g/dL (ref 12.0–15.0)
O2 Saturation: 73 %
Potassium: 4.1 mmol/L (ref 3.5–5.1)
Sodium: 131 mmol/L — ABNORMAL LOW (ref 135–145)
TCO2: 28 mmol/L (ref 22–32)
pCO2, Ven: 41.9 mmHg — ABNORMAL LOW (ref 44–60)
pH, Ven: 7.417 (ref 7.25–7.43)
pO2, Ven: 38 mmHg (ref 32–45)

## 2022-11-19 LAB — URINALYSIS, MICROSCOPIC (REFLEX)

## 2022-11-19 MED ORDER — ONDANSETRON HCL 4 MG/2ML IJ SOLN
4.0000 mg | Freq: Once | INTRAMUSCULAR | Status: AC
  Administered 2022-11-19: 4 mg via INTRAVENOUS
  Filled 2022-11-19: qty 2

## 2022-11-19 MED ORDER — VANCOMYCIN HCL IN DEXTROSE 1-5 GM/200ML-% IV SOLN
1000.0000 mg | Freq: Once | INTRAVENOUS | Status: AC
  Administered 2022-11-19: 1000 mg via INTRAVENOUS
  Filled 2022-11-19: qty 200

## 2022-11-19 MED ORDER — DIPHENHYDRAMINE HCL 25 MG PO CAPS
25.0000 mg | ORAL_CAPSULE | Freq: Once | ORAL | Status: AC
  Administered 2022-11-19: 25 mg via ORAL
  Filled 2022-11-19: qty 1

## 2022-11-19 MED ORDER — LACTATED RINGERS IV SOLN: INTRAVENOUS | Status: AC

## 2022-11-19 MED ORDER — SODIUM CHLORIDE 0.9 % IV SOLN
2.0000 g | Freq: Once | INTRAVENOUS | Status: AC
  Administered 2022-11-19: 2 g via INTRAVENOUS
  Filled 2022-11-19: qty 12.5

## 2022-11-19 MED ORDER — HYDROCODONE-ACETAMINOPHEN 5-325 MG PO TABS
1.0000 | ORAL_TABLET | Freq: Once | ORAL | Status: AC
  Administered 2022-11-19: 1 via ORAL
  Filled 2022-11-19: qty 1

## 2022-11-19 MED ORDER — SODIUM CHLORIDE 0.9 % IV SOLN: 2.0000 g | INTRAVENOUS | Status: DC

## 2022-11-19 MED ORDER — LACTATED RINGERS IV BOLUS
1000.0000 mL | Freq: Once | INTRAVENOUS | Status: AC
  Administered 2022-11-19: 1000 mL via INTRAVENOUS

## 2022-11-19 MED ORDER — VANCOMYCIN VARIABLE DOSE PER UNSTABLE RENAL FUNCTION (PHARMACIST DOSING): Status: DC

## 2022-11-19 NOTE — Assessment & Plan Note (Signed)
Rehydrate administer IV fluids check urine electrolytes

## 2022-11-19 NOTE — Sepsis Progress Note (Signed)
Elink following for Sepsis Protocol 

## 2022-11-19 NOTE — Assessment & Plan Note (Signed)
-  SIRS criteria met with     RR >20 Today's Vitals   11/19/22 1810 11/19/22 2046 11/19/22 2100 11/19/22 2115  BP:   (!) 127/57   Pulse:   64 99  Resp:   (!) 23 (!) 28  Temp:      TempSrc:      SpO2:   (!) 89% 94%  Weight:  67.1 kg    Height:  4\' 9"  (1.448 m)    PainSc: 8       Body mass index is 32.03 kg/m.    The recent clinical data is shown below. Vitals:   11/19/22 1805 11/19/22 2046 11/19/22 2100 11/19/22 2115  BP: 113/77  (!) 127/57   Pulse: 97  64 99  Resp: 18  (!) 23 (!) 28  Temp: 98.2 F (36.8 C)     TempSrc: Oral     SpO2: 97%  (!) 89% 94%  Weight:  67.1 kg    Height:  4\' 9"  (1.448 m)        -Most likely source being:  Cellulitis, soft tissue infection,    Patient meeting criteria for Severe sepsis with    evidence of end organ damage/organ dysfunction such as     elevated lactic acid >2     Component Value Date/Time   LATICACIDVEN 3.1 (HH) 11/19/2022 1951     - Obtain serial lactic acid and procalcitonin level.  - Initiated IV antibiotics in ER: Antibiotics Given (last 72 hours)     Date/Time Action Medication Dose Rate   11/19/22 2052 New Bag/Given   ceFEPIme (MAXIPIME) 2 g in sodium chloride 0.9 % 100 mL IVPB 2 g 200 mL/hr   11/19/22 2124 New Bag/Given   vancomycin (VANCOCIN) IVPB 1000 mg/200 mL premix 1,000 mg 200 mL/hr       Will continue  on : Cefepime and Vanco   - await results of blood and urine culture  - Rehydrate aggressively  Intravenous fluids were administered       10:57 PM

## 2022-11-19 NOTE — ED Notes (Signed)
Date and time results received: 11/19/22 2035 (use smartphrase ".now" to insert current time)  Test: lactic Critical Value: 3.2  Name of Provider Notified: lawsing  Orders Received? Or Actions Taken?:  abx, fluids

## 2022-11-19 NOTE — Assessment & Plan Note (Signed)
-  admit per  cellulitis protocol will         continue current antibiotic choice cefepime and Vanco      plain films showed:   no evidence of air  no evidence of osteomyelitis   no  foreign   objects    Will obtain MRSA screening,       obtain blood cultures  if febrile or septic     further antibiotic adjustment pending above results

## 2022-11-19 NOTE — Progress Notes (Signed)
Pharmacy Antibiotic Note  Catherine Wallace is a 72 y.o. female for which pharmacy has been consulted for cefepime and vancomycin dosing for cellulitis.  Patient with a history of mild cognitive impairment, COPD, venous insufficiency w/ chronically bilateral LE edema. Patient presenting with concern for cellulitis not responding to outpatient antibiotics.  SCr 1.44 - baseline ~0.8 WBC 9.7; LA 3.1; T 98.2; HR 99; RR 28  Plan: Cefepime 2g q24hr  Vancomycin 1000 mg once, subsequent dosing as indicated per random vancomycin level until renal function stable and/or improved, at which time scheduled dosing can be considered Monitor WBC, fever, renal function, cultures De-escalate when able  Height: 4\' 9"  (144.8 cm) Weight: 67.1 kg (148 lb) IBW/kg (Calculated) : 38.6  Temp (24hrs), Avg:98.2 F (36.8 C), Min:98.2 F (36.8 C), Max:98.2 F (36.8 C)  Recent Labs  Lab 11/19/22 1847 11/19/22 1951  WBC  --  9.7  CREATININE 1.44*  --   LATICACIDVEN  --  3.1*    Estimated Creatinine Clearance: 27.5 mL/min (A) (by C-G formula based on SCr of 1.44 mg/dL (H)).    Allergies  Allergen Reactions   Tape Rash   Penicillins Nausea And Vomiting   Amoxicillin Rash   Hydrocodone Rash   Sulfamethoxazole-Trimethoprim Rash   Microbiology results: Pending  Thank you for allowing pharmacy to be a part of this patient's care.  Delmar Landau, PharmD, BCPS 11/19/2022 10:52 PM ED Clinical Pharmacist -  [phone]

## 2022-11-19 NOTE — H&P (Signed)
Catherine Wallace ZOX:[id number] DOB: [birth year] DOA: 11/19/2022     PCP: Gordan Payment., MD   Outpatient Specialists: NONE    Patient arrived to ER on 11/19/22 at 1753 Referred by Attending Ernie Avena, MD   Patient coming from:    home Lives alone,        Chief Complaint:   Chief Complaint  Patient presents with   Wound Infection    HPI: Catherine Wallace is a 72 y.o. female with medical history significant of COPD chronic venous insufficiency, chronic back pain, hypothyroidism    Presented with  left leg swelling Has been treated for the past 2 wks Presents with left leg swelling for the past few days.  Noticed that she has significant more swelling that is progressing. PT and edema and drainage Known history of venous insufficiency     Noted diffuse rash for the past 2 days not itchy blanching  Denies significant ETOH intake   Does  smoke  but not interested in quitting   Lab Results  Component Value Date   SARSCOV2NAA NOT DETECTED 03/08/2019   SARSCOV2NAA NEGATIVE 03/04/2019        Regarding pertinent Chronic problems:     Hypothyroidism:  on synthroid    While in ER: Clinical Course as of 11/19/22 2245  Mon Nov 19, 2022  2040 Lactic Acid, Venous(!!): 3.1 [JL]    Clinical Course User Index [JL] Ernie Avena, MD       Lab Orders         Blood Culture (routine x 2)         Lactic acid, plasma         CBC with Differential         Comprehensive metabolic panel         CBC with Differential/Platelet         Lactic acid, plasma         Urinalysis, Routine w reflex microscopic -Urine, Clean Catch         Urinalysis, Microscopic (reflex)        Left leg no gas CXR -  NON acute    Following Medications were ordered in ER: Medications  lactated ringers infusion ( Intravenous New Bag/Given 11/19/22 2055)  HYDROcodone-acetaminophen (NORCO/VICODIN) 5-325 MG per tablet 1 tablet (has no administration in time range)  diphenhydrAMINE (BENADRYL) capsule 25 mg  (has no administration in time range)  lactated ringers bolus 1,000 mL (1,000 mLs Intravenous New Bag/Given 11/19/22 2048)  vancomycin (VANCOCIN) IVPB 1000 mg/200 mL premix (1,000 mg Intravenous New Bag/Given 11/19/22 2124)  ceFEPIme (MAXIPIME) 2 g in sodium chloride 0.9 % 100 mL IVPB (0 g Intravenous Stopped 11/19/22 2122)  ondansetron (ZOFRAN) injection 4 mg (4 mg Intravenous Given 11/19/22 2119)       ED Triage Vitals  Enc Vitals Group     BP 11/19/22 1805 113/77     Pulse Rate 11/19/22 1805 97     Resp 11/19/22 1805 18     Temp 11/19/22 1805 98.2 F (36.8 C)     Temp Source 11/19/22 1805 Oral     SpO2 11/19/22 1805 97 %     Weight 11/19/22 2046 148 lb (67.1 kg)     Height 11/19/22 2046 4\' 9"  (1.448 m)     Head Circumference --      Peak Flow --      Pain Score 11/19/22 1810 8     Pain Loc --  Pain Edu? --      Excl. in GC? --   TMAX(24)@     _________________________________________ Significant initial  Findings: Abnormal Labs Reviewed  COMPREHENSIVE METABOLIC PANEL - Abnormal; Notable for the following components:      Result Value   Sodium 133 (*)    CO2 19 (*)    Glucose, Bld 161 (*)    BUN 26 (*)    Creatinine, Ser 1.44 (*)    Albumin 3.3 (*)    GFR, Estimated 38 (*)    Anion gap 16 (*)    All other components within normal limits  CBC WITH DIFFERENTIAL/PLATELET - Abnormal; Notable for the following components:   Neutro Abs 8.3 (*)    All other components within normal limits  LACTIC ACID, PLASMA - Abnormal; Notable for the following components:   Lactic Acid, Venous 3.1 (*)    All other components within normal limits  URINALYSIS, ROUTINE W REFLEX MICROSCOPIC - Abnormal; Notable for the following components:   APPearance CLOUDY (*)    Specific Gravity, Urine >1.030 (*)    Bilirubin Urine SMALL (*)    Leukocytes,Ua TRACE (*)    All other components within normal limits  URINALYSIS, MICROSCOPIC (REFLEX) - Abnormal; Notable for the following components:    Bacteria, UA FEW (*)    All other components within normal limits      _________________________ Troponin   No results for input(s): "CKTOTAL", "CKMB", "TROPONINIHS", "RELINDX" in the last 72 hours.    ECG: Ordered Personally reviewed and interpreted by me showing: HR : 89 Rhythm: Sinus rhythm Low voltage, precordial leads QTC 436   _______________ This patient meets SIRS Criteria and may be septic.    The recent clinical data is shown below. Vitals:   11/19/22 1805 11/19/22 2046 11/19/22 2100 11/19/22 2115  BP: 113/77  (!) 127/57   Pulse: 97  64 99  Resp: 18  (!) 23 (!) 28  Temp: 98.2 F (36.8 C)     TempSrc: Oral     SpO2: 97%  (!) 89% 94%  Weight:  67.1 kg    Height:  4\' 9"  (1.448 m)      WBC     Component Value Date/Time   WBC 9.7 11/19/2022 1951   LYMPHSABS 0.7 11/19/2022 1951   MONOABS 0.5 11/19/2022 1951   EOSABS 0.2 11/19/2022 1951   BASOSABS 0.0 11/19/2022 1951    Lactic Acid, Venous    Component Value Date/Time   LATICACIDVEN 3.1 (HH) 11/19/2022 1951     Procalcitonin   Ordered    UA   no evidence of UTI      Urine analysis:    Component Value Date/Time   COLORURINE YELLOW 11/19/2022 2202   APPEARANCEUR CLOUDY (A) 11/19/2022 2202   LABSPEC >1.030 (H) 11/19/2022 2202   PHURINE 6.0 11/19/2022 2202   GLUCOSEU NEGATIVE 11/19/2022 2202   HGBUR NEGATIVE 11/19/2022 2202   BILIRUBINUR SMALL (A) 11/19/2022 2202   KETONESUR NEGATIVE 11/19/2022 2202   PROTEINUR NEGATIVE 11/19/2022 2202   UROBILINOGEN 0.2 12/13/2006 1249   NITRITE NEGATIVE 11/19/2022 2202   LEUKOCYTESUR TRACE (A) 11/19/2022 2202    Results for orders placed or performed during the hospital encounter of 03/17/19  Urine culture     Status: Abnormal   Collection Time: 03/17/19  9:00 PM   Specimen: Urine, Clean Catch  Result Value Ref Range Status   Specimen Description   Final    URINE, CLEAN CATCH Performed at [facility], [address], [place], [place]  [address]    Special Requests   Final    NONE Performed at [facility], [address] [place], [place] [address]    Culture (A)  Final    >=100,000 COLONIES/mL PSEUDOMONAS AERUGINOSA 70,000 COLONIES/mL KLEBSIELLA PNEUMONIAE    Report Status 03/23/2019 FINAL  Final   Organism ID, Bacteria PSEUDOMONAS AERUGINOSA (A)  Final   Organism ID, Bacteria KLEBSIELLA PNEUMONIAE (A)  Final      Susceptibility   Klebsiella pneumoniae - MIC*    AMPICILLIN RESISTANT Resistant     CEFAZOLIN <=4 SENSITIVE Sensitive     CEFTRIAXONE <=1 SENSITIVE Sensitive     CIPROFLOXACIN <=0.25 SENSITIVE Sensitive     GENTAMICIN <=1 SENSITIVE Sensitive     IMIPENEM <=0.25 SENSITIVE Sensitive     NITROFURANTOIN 64 INTERMEDIATE Intermediate     TRIMETH/SULFA <=20 SENSITIVE Sensitive     AMPICILLIN/SULBACTAM 4 SENSITIVE Sensitive     PIP/TAZO <=4 SENSITIVE Sensitive     Extended ESBL NEGATIVE Sensitive     * 70,000 COLONIES/mL KLEBSIELLA PNEUMONIAE   Pseudomonas aeruginosa - MIC*    CEFTAZIDIME 4 SENSITIVE Sensitive     CIPROFLOXACIN >=4 RESISTANT Resistant     GENTAMICIN 2 SENSITIVE Sensitive     IMIPENEM 2 SENSITIVE Sensitive     PIP/TAZO 32 SENSITIVE Sensitive     CEFEPIME 8 SENSITIVE Sensitive     * >=100,000 COLONIES/mL PSEUDOMONAS AERUGINOSA    ABX started Antibiotics Given (last 72 hours)     Date/Time Action Medication Dose Rate   11/19/22 2052 New Bag/Given   ceFEPIme (MAXIPIME) 2 g in sodium chloride 0.9 % 100 mL IVPB 2 g 200 mL/hr   11/19/22 2124 New Bag/Given   vancomycin (VANCOCIN) IVPB 1000 mg/200 mL premix 1,000 mg 200 mL/hr       No results found for the last 90 days.   __________________________________________________________ Recent Labs  Lab 11/19/22 1847  NA 133*  K 4.4  CO2 19*  GLUCOSE 161*  BUN 26*  CREATININE 1.44*  CALCIUM 9.4    Cr  Up from baseline see below Lab Results  Component Value Date   CREATININE 1.44 (H) 11/19/2022   CREATININE 0.56  04/01/2019   CREATININE 0.50 03/17/2019    Recent Labs  Lab 11/19/22 1847  AST 30  ALT 14  ALKPHOS 107  BILITOT 0.5  PROT 6.6  ALBUMIN 3.3*   Lab Results  Component Value Date   CALCIUM 9.4 11/19/2022       Plt: Lab Results  Component Value Date   PLT 212 11/19/2022    Recent Labs  Lab 11/19/22 1951  WBC 9.7  NEUTROABS 8.3*  HGB 14.4  HCT 43.2  MCV 92.7  PLT 212    HG/HCT  stable,       Component Value Date/Time   HGB 14.4 11/19/2022 1951   HCT 43.2 11/19/2022 1951   MCV 92.7 11/19/2022 1951    _______________________________________________ Hospitalist was called for admission for   Cellulitis of left lower extremity    The following Work up has been ordered so far:  Orders Placed This Encounter  Procedures   Blood Culture (routine x 2)   DG Chest Port 1 View   DG Tibia/Fibula Left   Lactic acid, plasma   CBC with Differential   Comprehensive metabolic panel   CBC with Differential/Platelet   Lactic acid, plasma   Urinalysis, Routine w reflex microscopic -Urine, Clean Catch   Urinalysis,  Microscopic (reflex)   Diet NPO time specified   Document height and weight   Assess and Document Glasgow Coma Scale   Document vital signs within 1-hour of fluid bolus completion.  Notify provider of abnormal vital signs despite fluid resuscitation.   Refer to Sidebar Report: Sepsis Bundle ED/IP   Notify provider for difficulties obtaining IV access   Initiate Carrier Fluid Protocol   DO NOT delay antibiotics if unable to obtain blood culture.   Code Sepsis activation.  This occurs automatically when order is signed and prioritizes pharmacy, lab, and radiology services for STAT collections and interventions.  If [facility] downtime, call [facility] [phone]) to activate Code Sepsis.   pharmacy consult   Consult for [facility] Admission   ED EKG 12-Lead   EKG 12-Lead   Insert peripheral IV X 1   Insert 2nd peripheral IV if not already present.   VAS [place]  LOWER EXTREMITY VENOUS (DVT)     OTHER Significant initial  Findings:  labs showing:     DM  labs:  HbA1C: No results for input(s): "HGBA1C" in the last 8760 hours.     CBG (last 3)  No results for input(s): "GLUCAP" in the last 72 hours.        Cultures:    Component Value Date/Time   SDES  03/17/2019 2100    URINE, CLEAN CATCH Performed at [facility], [address], [place], [place] [address]    SPECREQUEST  03/17/2019 2100    NONE Performed at [facility], [address] [place], [place] [address]    CULT (A) 03/17/2019 2100    >=100,000 COLONIES/mL PSEUDOMONAS AERUGINOSA 70,000 COLONIES/mL KLEBSIELLA PNEUMONIAE    REPTSTATUS 03/23/2019 FINAL 03/17/2019 2100     Radiological Exams on Admission: DG Tibia/Fibula Left  Result Date: 11/19/2022 CLINICAL DATA:  Questionable sepsis - evaluate for abnormality EXAM: LEFT TIBIA AND FIBULA - 2 VIEW COMPARISON:  January 28, 2019 FINDINGS: No acute fracture or dislocation. Joint spaces and alignment are maintained. No area of erosion or osseous destruction. No unexpected radiopaque foreign body. Soft tissue edema. IMPRESSION: 1. No acute fracture or dislocation. 2. Soft tissue edema. Electronically Signed   By: Meda Klinefelter M.D.   On: 11/19/2022 21:29   DG Chest Port 1 View  Result Date: 11/19/2022 CLINICAL DATA:  Questionable sepsis - evaluate for abnormality EXAM: PORTABLE CHEST 1 VIEW COMPARISON:  February 17, 2019 FINDINGS: The cardiomediastinal silhouette is unchanged in contour.Atherosclerotic calcifications of the tortuous thoracic aorta. No pleural effusion. No pneumothorax. No acute pleuroparenchymal abnormality. IMPRESSION: No acute cardiopulmonary abnormality. Electronically Signed   By: Meda Klinefelter M.D.   On: 11/19/2022 21:29   _______________________________________________________________________________________________________ Latest  Blood pressure (!) 127/57,  pulse 99, temperature 98.2 F (36.8 C), temperature source Oral, resp. rate (!) 28, height 4\' 9"  (1.448 m), weight 67.1 kg, SpO2 94 %.   Vitals  labs and radiology finding personally reviewed  Review of Systems:    Pertinent positives include:   chills, fatigue, left leg swelling  Constitutional:  No weight loss, night sweats, Fevers, weight loss  HEENT:  No headaches, Difficulty swallowing,Tooth/dental problems,Sore throat,  No sneezing, itching, ear ache, nasal congestion, post nasal drip,  Cardio-vascular:  No chest pain, Orthopnea, PND, anasarca, dizziness, palpitations.no Bilateral lower extremity swelling  GI:  No heartburn, indigestion, abdominal pain, nausea, vomiting, diarrhea, change in bowel habits, loss of appetite, melena, blood in stool, hematemesis Resp:  no shortness of breath at rest. No  dyspnea on exertion, No excess mucus, no productive cough, No non-productive cough, No coughing up of blood.No change in color of mucus.No wheezing. Skin:  no rash or lesions. No jaundice GU:  no dysuria, change in color of urine, no urgency or frequency. No straining to urinate.  No flank pain.  Musculoskeletal:  No joint pain or no joint swelling. No decreased range of motion. No back pain.  Psych:  No change in mood or affect. No depression or anxiety. No memory loss.  Neuro: no localizing neurological complaints, no tingling, no weakness, no double vision, no gait abnormality, no slurred speech, no confusion  All systems reviewed and apart from HOPI all are negative _______________________________________________________________________________________________ Past Medical History:  No past medical history on file.    No past surgical history on file.  Social History:  Ambulatory   walker      reports that she has been smoking. She has never used smokeless tobacco. No history on file for alcohol use and drug use.   Family History  Fam hx of  CAd ______________________________________________________________________________________________ Allergies: Allergies  Allergen Reactions   Tape Rash   Penicillins Nausea And Vomiting   Amoxicillin Rash   Hydrocodone Rash   Sulfamethoxazole-Trimethoprim Rash     Prior to Admission medications   Medication Sig Start Date End Date Taking? Authorizing Provider  carboxymethylcellulose (REFRESH PLUS) 0.5 % SOLN Place 2 drops into both eyes 3 (three) times daily.    Yes [provider]  cephALEXin (KEFLEX) 250 MG capsule Take 250 mg by mouth 4 (four) times daily.   Yes [provider]  Cholecalciferol (VITAMIN D) 50 MCG (2000 UT) tablet Take 2,000 Units by mouth daily.   Yes [provider]  furosemide (LASIX) 20 MG tablet Take 10 mg by mouth every other day. 04/25/22  Yes [provider]  levothyroxine (SYNTHROID) 25 MCG tablet Take 25 mcg by mouth daily before breakfast.   Yes [provider]  loratadine (CLARITIN) 10 MG tablet Take 10 mg by mouth daily as needed for allergies.   Yes [provider]  Multiple Vitamins-Minerals (ALIVE WOMENS 50+ COMPLETE MV PO) Take 1 tablet by mouth daily.   Yes [provider]  oxybutynin (DITROPAN-XL) 5 MG 24 hr tablet Take 5 mg by mouth daily. 12/11/18  Yes [provider]    ___________________________________________________________________________________________________ Physical Exam:    11/19/2022    9:15 PM 11/19/2022    9:00 PM 11/19/2022    8:46 PM  Vitals with BMI  Height   4\' 9"   Weight   148 lbs  BMI   32.02  Systolic  127   Diastolic  57   Pulse 99 64      1. General:  in No  Acute distress   Chronically ill  -appearing 2. Psychological: Alert and   Oriented 3. Head/ENT: Dry Mucous Membranes                          Head Non traumatic, neck supple                          Poor Dentition 4. SKIN: decreased Skin turgor,  Skin clean significant redness and  swelling    Diffuse rash 5. Heart: Regular rate and rhythm no  Murmur, no Rub or gallop 6. Lungs:   no wheezes or crackles   7. Abdomen: Soft,  non-tender, Non distended   obese  bowel sounds present 8. Lower extremities: no clubbing, cyanosis, 1+edema 9. Neurologically Grossly intact, moving all 4 extremities equally   10. MSK: Normal range of motion    Chart has been reviewed  ______________________________________________________________________________________________  Assessment/Plan 72 y.o. female with medical history significant of COPD chronic venous insufficiency, chronic back pain, hypothyroidism  admited for   Cellulitis of left lower extremity, sepsis     Present on Admission:  Acute renal failure (ARF) (HCC)  Cellulitis  Dehydration  Sepsis (HCC)  Hypothyroidism  Tobacco abuse  Rash  COPD (chronic obstructive pulmonary disease) (HCC)    Acute renal failure (ARF) (HCC) Rehydrate administer IV fluids check urine electrolytes  Dehydration Will rehydrate  Cellulitis -admit per  cellulitis protocol will         continue current antibiotic choice cefepime and Vanco      plain films showed:   no evidence of air  no evidence of osteomyelitis   no  foreign   objects    Will obtain MRSA screening,       obtain blood cultures  if febrile or septic     further antibiotic adjustment pending above results   Sepsis (HCC)  -SIRS criteria met with     RR >20 Today's Vitals   11/19/22 1810 11/19/22 2046 11/19/22 2100 11/19/22 2115  BP:   (!) 127/57   Pulse:   64 99  Resp:   (!) 23 (!) 28  Temp:      TempSrc:      SpO2:   (!) 89% 94%  Weight:  67.1 kg    Height:  4\' 9"  (1.448 m)    PainSc: 8       Body mass index is 32.03 kg/m.    The recent clinical data is shown below. Vitals:   11/19/22 1805 11/19/22 2046 11/19/22 2100 11/19/22 2115  BP: 113/77  (!) 127/57   Pulse: 97  64 99  Resp: 18  (!) 23 (!) 28  Temp: 98.2 F (36.8 C)     TempSrc: Oral      SpO2: 97%  (!) 89% 94%  Weight:  67.1 kg    Height:  4\' 9"  (1.448 m)        -Most likely source being:  Cellulitis, soft tissue infection,    Patient meeting criteria for Severe sepsis with    evidence of end organ damage/organ dysfunction such as     elevated lactic acid >2     Component Value Date/Time   LATICACIDVEN 3.1 (HH) 11/19/2022 1951     - Obtain serial lactic acid and procalcitonin level.  - Initiated IV antibiotics in ER: Antibiotics Given (last 72 hours)     Date/Time Action Medication Dose Rate   11/19/22 2052 New Bag/Given   ceFEPIme (MAXIPIME) 2 g in sodium chloride 0.9 % 100 mL IVPB 2 g 200 mL/hr   11/19/22 2124 New Bag/Given   vancomycin (VANCOCIN) IVPB 1000 mg/200 mL premix 1,000 mg 200 mL/hr       Will continue  on : Cefepime and Vanco   - await results of blood and urine culture  - Rehydrate aggressively  Intravenous fluids were administered       10:57 PM   Hypothyroidism - Check TSH continue home medications Synthroid at po q day   Tobacco abuse  - Spoke about importance of quitting spent 5 minutes discussing options for treatment, prior attempts at quitting, and dangers of smoking  -At this point patient is  NOT  interested in quitting  - refused nicotine patch   - nursing tobacco cessation protocol   Rash Unclear etiology  Will need to see what antibiotcs she was getting at home  Also consider defuse fungal infection Blood culture pending My need dermatology evaluation   COPD (chronic obstructive pulmonary disease) (HCC) Chronic albuterol pRN curretnly non contributary  Other plan as per orders.  DVT prophylaxis:  SCD    Code Status: DNR/DNI   as per patient  family  I had personally discussed CODE STATUS with patient and family     ACP none   Family Communication:   Family  at  Bedside  plan of care was discussed   with   Son,    Diet  Diet Orders (From admission, onward)     Start     Ordered   11/19/22  2008  Diet NPO time specified  (Undifferentiated presentation (screening labs and basic nursing orders))  Diet effective now        11/19/22 2008            Disposition Plan:      To home once workup is complete and patient is stable   Following barriers for discharge:                            Electrolytes corrected                                                           Pain controlled with PO medications                               Afebrile, white count improving able to transition to PO antibiotics                       Would benefit from PT/OT eval prior to DC  Ordered                                      Transition of care consulted                   Nutrition    consulted                                     Consults called: none   Admission status:  ED Disposition     ED Disposition  Admit   Condition  --   Comment  Hospital [facility]: [facility] [[id number]]  Level of Care: Progressive [102]  Admit to Progressive based on following criteria: MULTISYSTEM THREATS such as stable sepsis, metabolic/electrolyte imbalance with or without encephalopathy that is responding to early treatment.  May admit patient to [facility] or [facility] if equivalent level of care is available:: No  Covid Evaluation: Asymptomatic - no recent exposure (last 10 days) testing not required  Diagnosis: Cellulitis [630160]  Admitting Physician: Therisa Doyne [[id number]]  Attending Physician: Therisa Doyne [[id number]]  Certification:: I certify this patient will need inpatient services for  at least 2 midnights  Estimated Length of Stay: 2           inpatient     I Expect 2 midnight stay secondary to severity of patient's current illness need for inpatient interventions justified by the following:  hemodynamic instability despite optimal treatment (tachycardia  )   Severe lab/radiological/exam abnormalities including:   Cellulitis and extensive comorbidities  including:  Obesity    That are currently affecting medical management.   I expect  patient to be hospitalized for 2 midnights requiring inpatient medical care.  Patient is at high risk for adverse outcome (such as loss of life or disability) if not treated.  Indication for inpatient stay as follows:    severe pain requiring acute inpatient management,     Need for IV antibiotics, IV fluids,     Level of care      progressive tele indefinitely please discontinue once patient no longer qualifies COVID-19 Labs     Laurier Jasperson 11/19/2022, 11:22 PM    [facility]     after 2 AM please page floor coverage PA If 7AM-7PM, please contact the day team taking care of the patient using [email or web address]

## 2022-11-19 NOTE — Assessment & Plan Note (Signed)
Chronic albuterol pRN curretnly non contributary

## 2022-11-19 NOTE — Assessment & Plan Note (Addendum)
Unclear etiology  Will need to see what antibiotcs she was getting at home  Also consider defuse fungal infection Blood culture pending My need dermatology evaluation

## 2022-11-19 NOTE — ED Triage Notes (Signed)
Pt with redness/significant swelling to left lower leg and foot.  States 2 days ago a line was drawn around an area and was told if the redness spread to come back in.  She states " I don't like hospitals" so she didn't seek care until today even though the redness and swelling have significantly spread.  Pt has pitting edema, copious drainage of leg and foot.  Feels warm to touch in areas and cool in others.

## 2022-11-19 NOTE — ED Provider Notes (Addendum)
[place] [facility]MENT AT [facility] Provider Note   CSN: [id number] Arrival date & time: 11/19/22  1753     History  Chief Complaint  Patient presents with   Wound Infection    Catherine Wallace is a 72 y.o. female.  HPI   72 year old female with medical history significant for mild cognitive impairment, COPD, venous insufficiency chronically with bilateral lower extremity edema with which is chronic.  The patient was seen outpatient at some point over the last 2 weeks but cannot tell me exactly when.  She comes from home today.  She states that she was prescribed 2 different antibiotics over the last 2 weeks but states that her lower extremity pain and redness and warmth has worsened.  A marker had been drawn around the original area of erythema which has now spread outside that circle.  She is very hesitant to come into the hospital.  She endorses worsening pain and swelling in the left lower extremity.  She denies any fevers or chills.  She denies any chest pain or shortness of breath.  Home Medications Prior to Admission medications   Medication Sig Start Date End Date Taking? Authorizing Provider  carboxymethylcellulose (REFRESH PLUS) 0.5 % SOLN Place 2 drops into both eyes 3 (three) times daily.    Yes [provider]  cephALEXin (KEFLEX) 250 MG capsule Take 250 mg by mouth 4 (four) times daily.   Yes [provider]  Cholecalciferol (VITAMIN D) 50 MCG (2000 UT) tablet Take 2,000 Units by mouth daily.   Yes [provider]  furosemide (LASIX) 20 MG tablet Take 10 mg by mouth every other day. 04/25/22  Yes [provider]  levothyroxine (SYNTHROID) 25 MCG tablet Take 25 mcg by mouth daily before breakfast.   Yes [provider]  loratadine (CLARITIN) 10 MG tablet Take 10 mg by mouth daily as needed for allergies.   Yes [provider]  Multiple Vitamins-Minerals (ALIVE WOMENS 50+ COMPLETE MV PO) Take 1 tablet by  mouth daily.   Yes [provider]  oxybutynin (DITROPAN-XL) 5 MG 24 hr tablet Take 5 mg by mouth daily. 12/11/18  Yes [provider]      Allergies    Tape, Penicillins, Amoxicillin, Hydrocodone, and Sulfamethoxazole-trimethoprim    Review of Systems   Review of Systems  All other systems reviewed and are negative.   Physical Exam Updated Vital Signs BP (!) 127/57   Pulse 99   Temp 99.3 F (37.4 C) (Oral)   Resp (!) 28   Ht 4\' 9"  (1.448 m)   Wt 67.1 kg   SpO2 94%   BMI 32.03 kg/m  Physical Exam Vitals and nursing note reviewed.  Constitutional:      General: She is not in acute distress.    Appearance: She is well-developed.  HENT:     Head: Normocephalic and atraumatic.  Eyes:     Conjunctiva/sclera: Conjunctivae normal.  Cardiovascular:     Rate and Rhythm: Normal rate and regular rhythm.  Pulmonary:     Effort: Pulmonary effort is normal. No respiratory distress.     Breath sounds: Normal breath sounds.  Abdominal:     Palpations: Abdomen is soft.     Tenderness: There is no abdominal tenderness.  Musculoskeletal:        General: No swelling.     Cervical back: Neck supple.     Comments: 2+ pitting edema to the level of the anterior tibia, erythema and warmth of  the left lower extremity with associated tenderness to palpation.  No crepitus palpated.  Intact distal pulses appreciated.  Honey crusting and purulent drainage noted from along the lateral aspect of the patient's leg.  Skin:    General: Skin is warm and dry.     Capillary Refill: Capillary refill takes less than 2 seconds.  Neurological:     Mental Status: She is alert.  Psychiatric:        Mood and Affect: Mood normal.      ED Results / Procedures / Treatments   Labs (all labs ordered are listed, but only abnormal results are displayed) Labs Reviewed  COMPREHENSIVE METABOLIC PANEL - Abnormal; Notable for the following components:      Result Value   Sodium 133 (*)    CO2  19 (*)    Glucose, Bld 161 (*)    BUN 26 (*)    Creatinine, Ser 1.44 (*)    Albumin 3.3 (*)    GFR, Estimated 38 (*)    Anion gap 16 (*)    All other components within normal limits  CBC WITH DIFFERENTIAL/PLATELET - Abnormal; Notable for the following components:   Neutro Abs 8.3 (*)    All other components within normal limits  LACTIC ACID, PLASMA - Abnormal; Notable for the following components:   Lactic Acid, Venous 3.1 (*)    All other components within normal limits  URINALYSIS, ROUTINE W REFLEX MICROSCOPIC - Abnormal; Notable for the following components:   APPearance CLOUDY (*)    Specific Gravity, Urine >1.030 (*)    Bilirubin Urine SMALL (*)    Leukocytes,Ua TRACE (*)    All other components within normal limits  URINALYSIS, MICROSCOPIC (REFLEX) - Abnormal; Notable for the following components:   Bacteria, UA FEW (*)    All other components within normal limits  CULTURE, BLOOD (ROUTINE X 2)  CULTURE, BLOOD (ROUTINE X 2)  MRSA NEXT GEN BY PCR, NASAL  LACTIC ACID, PLASMA  CBC WITH DIFFERENTIAL/PLATELET  LACTIC ACID, PLASMA  LACTIC ACID, PLASMA  PROTIME-INR  APTT  PROCALCITONIN  CREATININE, URINE, RANDOM  OSMOLALITY  OSMOLALITY, URINE  SODIUM, URINE, RANDOM  MAGNESIUM  PHOSPHORUS  CK  TSH    EKG EKG Interpretation  Date/Time:  Monday November 19 2022 20:20:45 EDT Ventricular Rate:  89 PR Interval:  168 QRS Duration: 90 QT Interval:  358 QTC Calculation: 436 R Axis:   55 Text Interpretation: Sinus rhythm Low voltage, precordial leads Confirmed by Ernie Avena ([id number]) on 11/19/2022 10:33:19 PM  Radiology DG Tibia/Fibula Left  Result Date: 11/19/2022 CLINICAL DATA:  Questionable sepsis - evaluate for abnormality EXAM: LEFT TIBIA AND FIBULA - 2 VIEW COMPARISON:  January 28, 2019 FINDINGS: No acute fracture or dislocation. Joint spaces and alignment are maintained. No area of erosion or osseous destruction. No unexpected radiopaque foreign body. Soft tissue  edema. IMPRESSION: 1. No acute fracture or dislocation. 2. Soft tissue edema. Electronically Signed   By: Meda Klinefelter M.D.   On: 11/19/2022 21:29   DG Chest Port 1 View  Result Date: 11/19/2022 CLINICAL DATA:  Questionable sepsis - evaluate for abnormality EXAM: PORTABLE CHEST 1 VIEW COMPARISON:  February 17, 2019 FINDINGS: The cardiomediastinal silhouette is unchanged in contour.Atherosclerotic calcifications of the tortuous thoracic aorta. No pleural effusion. No pneumothorax. No acute pleuroparenchymal abnormality. IMPRESSION: No acute cardiopulmonary abnormality. Electronically Signed   By: Meda Klinefelter M.D.   On: 11/19/2022 21:29    Procedures .Critical Care  Performed by: Ernie Avena,  MD Authorized by: Ernie Avena, MD   Critical care provider statement:    Critical care time (minutes):  30   Critical care was necessary to treat or prevent imminent or life-threatening deterioration of the following conditions:  Sepsis   Critical care was time spent personally by me on the following activities:  Development of treatment plan with patient or surrogate, discussions with consultants, evaluation of patient's response to treatment, examination of patient, ordering and review of laboratory studies, ordering and review of radiographic studies, ordering and performing treatments and interventions, pulse oximetry, re-evaluation of patient's condition and review of old charts   Care discussed with: admitting provider       Medications Ordered in ED Medications  lactated ringers infusion ( Intravenous New Bag/Given 11/19/22 2055)  ceFEPIme (MAXIPIME) 2 g in sodium chloride 0.9 % 100 mL IVPB (has no administration in time range)  vancomycin variable dose per unstable renal function (pharmacist dosing) (has no administration in time range)  lactated ringers bolus 1,000 mL (0 mLs Intravenous Stopped 11/19/22 2250)  vancomycin (VANCOCIN) IVPB 1000 mg/200 mL premix (0 mg Intravenous  Stopped 11/19/22 2224)  ceFEPIme (MAXIPIME) 2 g in sodium chloride 0.9 % 100 mL IVPB (0 g Intravenous Stopped 11/19/22 2122)  ondansetron (ZOFRAN) injection 4 mg (4 mg Intravenous Given 11/19/22 2119)  HYDROcodone-acetaminophen (NORCO/VICODIN) 5-325 MG per tablet 1 tablet (1 tablet Oral Given 11/19/22 2300)  diphenhydrAMINE (BENADRYL) capsule 25 mg (25 mg Oral Given 11/19/22 2300)    ED Course/ Medical Decision Making/ A&P Clinical Course as of 11/19/22 2303  Mon Nov 19, 2022  2040 Lactic Acid, Venous(!!): 3.1 [JL]    Clinical Course User Index [JL] Ernie Avena, MD                             Medical Decision Making Amount and/or Complexity of Data Reviewed Labs: ordered. Decision-making details documented in ED Course. Radiology: ordered. ECG/medicine tests: ordered.  Risk Prescription drug management. Decision regarding hospitalization.    72 year old female with medical history significant for mild cognitive impairment, COPD, venous insufficiency chronically with bilateral lower extremity edema with which is chronic.  The patient was seen outpatient at some point over the last 2 weeks but cannot tell me exactly when.  She comes from home today.  She states that she was prescribed 2 different antibiotics over the last 2 weeks but states that her lower extremity pain and redness and warmth has worsened.  A marker had been drawn around the original area of erythema which has now spread outside that circle.  She is very hesitant to come into the hospital.  She endorses worsening pain and swelling in the left lower extremity.  She denies any fevers or chills.  She denies any chest pain or shortness of breath.    On arrival, the patient was afebrile, temperature 98.2, borderline tachycardic heart rate 97, BP 113/77, saturating 97% on room air.  The patient presents with concern for failed outpatient treatment of cellulitis in the setting of chronic lower extremity edema.  Additionally  considered DVT.  IV access was obtained and the patient was administered an IV fluid bolus, covered with broad-spectrum antibiotics to include vancomycin and cefepime.  Laboratory evaluation significant for CBC without a leukocytosis or anemia, lactic acid elevated to 3.1, CMP with a mild anion gap acidosis with a bicarbonate of 19 and anion gap of 16,  creatinine evidence of an AKI with a creatinine  of 1.44 from a baseline of around 1, urinalysis without clear evidence of UTI, blood cultures and urine cultures were collected prior to antibiotic administration.  XR of the left tib/fib revealed no soft tissue gas, and no crepitus found on exam. Low concern for necrotizing fasciitis at this time.  Septic workup was initiated given the patient's evidence of cellulitis, lactic acidosis, borderline elevated heart rate.  Hospitalist medicine consulted for admission, DVT ultrasound studies pending at time of admission.  Dr. Adela Glimpse was consulted and accepted the patient in admission.  Final Clinical Impression(s) / ED Diagnoses Final diagnoses:  Cellulitis of left lower extremity  Sepsis, due to unspecified organism, unspecified whether acute organ dysfunction present [facility])    Rx / DC Orders ED Discharge Orders     None         Ernie Avena, MD 11/19/22 2257    Ernie Avena, MD 11/19/22 2302    Ernie Avena, MD 11/19/22 2303

## 2022-11-19 NOTE — Assessment & Plan Note (Signed)
-   Check TSH continue home medications Synthroid at po q day

## 2022-11-19 NOTE — Assessment & Plan Note (Signed)
Will rehydrate ?

## 2022-11-19 NOTE — Subjective & Objective (Signed)
Presents with left leg swelling for the past few days.  Noticed that she has significant more swelling that is progressing. PT and edema and drainage Known history of venous insufficiency

## 2022-11-19 NOTE — Assessment & Plan Note (Signed)
-   Spoke about importance of quitting spent 5 minutes discussing options for treatment, prior attempts at quitting, and dangers of smoking  -At this point patient is  NOT  interested in quitting  - refused  nicotine patch   - nursing tobacco cessation protocol  

## 2022-11-19 NOTE — ED Provider Triage Note (Addendum)
Emergency Medicine Provider Triage Evaluation Note  Catherine Wallace , a 72 y.o. female  was evaluated in triage.  Pt complains of worsening leg wound for the past few days.  Patient was seen at urgent care a few days prior when she had a circle drawn around the area of inflammation and was told to return to the ER if she had any worsening.  Patient reports the worsening happened a few days ago and the redness spread outside of the circle however she does not hospitals about that we get better.  She denies any fevers but reports pain to the leg.  Denies any numbness or tingling.  Describes the pain as burning.  Review of Systems  Positive:  Negative:   Physical Exam  BP 113/77 (BP Location: Right Arm)   Pulse 97   Temp 98.2 F (36.8 C) (Oral)   Resp 18   SpO2 97%  Gen:   Awake, no distress   Resp:  Normal effort  MSK:   Moves extremities without difficulty  Other:  See below.  Compartments are firm but pliable.  Patient would not let me remove her pants or visualize like any further.  I am unable to evaluate the extent of her wounds or the erythema.  Medical Decision Making  Medically screening exam initiated at 6:14 PM.  Appropriate orders placed.  Meade Maw was informed that the remainder of the evaluation will be completed by another provider, this initial triage assessment does not replace that evaluation, and the importance of remaining in the ED until their evaluation is complete.  I can not fully lift up the patient's pant leg to wvaluate the wound, although her pant leg is soaked in the discharge. She will not allow me to cut the pant leg. She has significant swelling and erythema to the leg and foot with foul smelling weeping wound. I was able to doppler a pulse and marked with an X. Some parts of the leg feel hot, while others feel cool.    Achille Rich, PA-C 11/19/22 1818    Achille Rich, PA-C 11/19/22 1820

## 2022-11-20 ENCOUNTER — Inpatient Hospital Stay (HOSPITAL_COMMUNITY): Payer: Medicare PPO

## 2022-11-20 ENCOUNTER — Encounter (HOSPITAL_COMMUNITY): Payer: Self-pay | Admitting: Internal Medicine

## 2022-11-20 DIAGNOSIS — M7989 Other specified soft tissue disorders: Secondary | ICD-10-CM

## 2022-11-20 DIAGNOSIS — L03116 Cellulitis of left lower limb: Secondary | ICD-10-CM | POA: Diagnosis not present

## 2022-11-20 DIAGNOSIS — R509 Fever, unspecified: Secondary | ICD-10-CM

## 2022-11-20 LAB — COMPREHENSIVE METABOLIC PANEL
ALT: 18 U/L (ref 0–44)
AST: 45 U/L — ABNORMAL HIGH (ref 15–41)
Albumin: 2.6 g/dL — ABNORMAL LOW (ref 3.5–5.0)
Alkaline Phosphatase: 78 U/L (ref 38–126)
Anion gap: 9 (ref 5–15)
BUN: 17 mg/dL (ref 8–23)
CO2: 25 mmol/L (ref 22–32)
Calcium: 8.1 mg/dL — ABNORMAL LOW (ref 8.9–10.3)
Chloride: 99 mmol/L (ref 98–111)
Creatinine, Ser: 1 mg/dL (ref 0.44–1.00)
GFR, Estimated: 59 mL/min — ABNORMAL LOW (ref >60)
Glucose, Bld: 94 mg/dL (ref 70–99)
Potassium: 4.1 mmol/L (ref 3.5–5.1)
Sodium: 133 mmol/L — ABNORMAL LOW (ref 135–145)
Total Bilirubin: 0.5 mg/dL (ref 0.3–1.2)
Total Protein: 5.1 g/dL — ABNORMAL LOW (ref 6.5–8.1)

## 2022-11-20 LAB — OSMOLALITY: Osmolality: 285 mOsm/kg (ref 275–295)

## 2022-11-20 LAB — CBC
HCT: 37.5 % (ref 36.0–46.0)
Hemoglobin: 12.8 g/dL (ref 12.0–15.0)
MCH: 32.1 pg (ref 26.0–34.0)
MCHC: 34.1 g/dL (ref 30.0–36.0)
MCV: 94 fL (ref 80.0–100.0)
Platelets: 175 10*3/uL (ref 150–400)
RBC: 3.99 MIL/uL (ref 3.87–5.11)
RDW: 12.5 % (ref 11.5–15.5)
WBC: 7.7 10*3/uL (ref 4.0–10.5)
nRBC: 0 % (ref 0.0–0.2)

## 2022-11-20 LAB — PROTIME-INR
INR: 1.1 (ref 0.8–1.2)
Prothrombin Time: 14.4 seconds (ref 11.4–15.2)

## 2022-11-20 LAB — ECHOCARDIOGRAM COMPLETE
AV Mean grad: 4 mmHg
AV Peak grad: 7 mmHg
Ao pk vel: 1.32 m/s
Area-P 1/2: 3.99 cm2
Height: 57 in
S' Lateral: 2.4 cm
Weight: 2368 oz

## 2022-11-20 LAB — BLOOD GAS, VENOUS
Acid-Base Excess: 2.5 mmol/L — ABNORMAL HIGH (ref 0.0–2.0)
Bicarbonate: 27.9 mmol/L (ref 20.0–28.0)
O2 Saturation: 90.4 %
Patient temperature: 37
pCO2, Ven: 45 mmHg (ref 44–60)
pH, Ven: 7.4 (ref 7.25–7.43)
pO2, Ven: 56 mmHg — ABNORMAL HIGH (ref 32–45)

## 2022-11-20 LAB — PROCALCITONIN: Procalcitonin: 0.16 ng/mL

## 2022-11-20 LAB — LACTIC ACID, PLASMA: Lactic Acid, Venous: 1.1 mmol/L (ref 0.5–1.9)

## 2022-11-20 LAB — OSMOLALITY, URINE: Osmolality, Ur: 554 mOsm/kg (ref 300–900)

## 2022-11-20 LAB — TSH: TSH: 1.049 u[IU]/mL (ref 0.350–4.500)

## 2022-11-20 LAB — MRSA NEXT GEN BY PCR, NASAL: MRSA by PCR Next Gen: NOT DETECTED

## 2022-11-20 LAB — CK: Total CK: 155 U/L (ref 38–234)

## 2022-11-20 LAB — SODIUM, URINE, RANDOM: Sodium, Ur: 13 mmol/L

## 2022-11-20 LAB — PHOSPHORUS
Phosphorus: 2.4 mg/dL — ABNORMAL LOW (ref 2.5–4.6)
Phosphorus: 2.9 mg/dL (ref 2.5–4.6)

## 2022-11-20 LAB — CULTURE, BLOOD (ROUTINE X 2)

## 2022-11-20 LAB — APTT: aPTT: 29 seconds (ref 24–36)

## 2022-11-20 LAB — MAGNESIUM
Magnesium: 1.6 mg/dL — ABNORMAL LOW (ref 1.7–2.4)
Magnesium: 1.8 mg/dL (ref 1.7–2.4)

## 2022-11-20 LAB — CREATININE, URINE, RANDOM: Creatinine, Urine: 102 mg/dL

## 2022-11-20 MED ORDER — PROCHLORPERAZINE EDISYLATE 10 MG/2ML IJ SOLN
10.0000 mg | Freq: Four times a day (QID) | INTRAMUSCULAR | Status: DC | PRN
  Administered 2022-11-20: 10 mg via INTRAVENOUS
  Filled 2022-11-20: qty 2

## 2022-11-20 MED ORDER — MIDODRINE HCL 5 MG PO TABS
10.0000 mg | ORAL_TABLET | Freq: Three times a day (TID) | ORAL | Status: DC
  Administered 2022-11-20 – 2022-11-22 (×6): 10 mg via ORAL
  Filled 2022-11-20 (×6): qty 2

## 2022-11-20 MED ORDER — ACETAMINOPHEN 650 MG RE SUPP: 650.0000 mg | Freq: Four times a day (QID) | RECTAL | Status: DC | PRN

## 2022-11-20 MED ORDER — LACTATED RINGERS IV BOLUS
1000.0000 mL | Freq: Once | INTRAVENOUS | Status: AC
  Administered 2022-11-20: 1000 mL via INTRAVENOUS

## 2022-11-20 MED ORDER — FENTANYL CITRATE PF 50 MCG/ML IJ SOSY
12.5000 ug | PREFILLED_SYRINGE | INTRAMUSCULAR | Status: DC | PRN
  Administered 2022-11-20: 12.5 ug via INTRAVENOUS
  Administered 2022-11-20 (×2): 50 ug via INTRAVENOUS
  Administered 2022-11-20: 25 ug via INTRAVENOUS
  Administered 2022-11-21 – 2022-11-22 (×3): 50 ug via INTRAVENOUS
  Filled 2022-11-20 (×7): qty 1

## 2022-11-20 MED ORDER — LEVOTHYROXINE SODIUM 25 MCG PO TABS
25.0000 ug | ORAL_TABLET | Freq: Every day | ORAL | Status: DC
  Administered 2022-11-20 – 2022-11-24 (×5): 25 ug via ORAL
  Filled 2022-11-20 (×5): qty 1

## 2022-11-20 MED ORDER — ONDANSETRON HCL 4 MG/2ML IJ SOLN
4.0000 mg | Freq: Four times a day (QID) | INTRAMUSCULAR | Status: DC | PRN
  Administered 2022-11-20: 4 mg via INTRAVENOUS
  Filled 2022-11-20: qty 2

## 2022-11-20 MED ORDER — GADOBUTROL 1 MMOL/ML IV SOLN
6.5000 mL | Freq: Once | INTRAVENOUS | Status: AC | PRN
  Administered 2022-11-20: 6.5 mL via INTRAVENOUS

## 2022-11-20 MED ORDER — MAGNESIUM SULFATE IN D5W 1-5 GM/100ML-% IV SOLN
1.0000 g | Freq: Once | INTRAVENOUS | Status: AC
  Administered 2022-11-20: 1 g via INTRAVENOUS
  Filled 2022-11-20: qty 100

## 2022-11-20 MED ORDER — ACETAMINOPHEN 325 MG PO TABS
650.0000 mg | ORAL_TABLET | Freq: Four times a day (QID) | ORAL | Status: DC | PRN
  Administered 2022-11-20 (×2): 650 mg via ORAL
  Filled 2022-11-20 (×2): qty 2

## 2022-11-20 MED ORDER — ONDANSETRON HCL 4 MG PO TABS: 4.0000 mg | ORAL_TABLET | Freq: Four times a day (QID) | ORAL | Status: DC | PRN

## 2022-11-20 MED ORDER — LACTATED RINGERS IV SOLN: INTRAVENOUS | Status: DC

## 2022-11-20 MED ORDER — VANCOMYCIN HCL IN DEXTROSE 1-5 GM/200ML-% IV SOLN
1000.0000 mg | INTRAVENOUS | Status: DC
  Administered 2022-11-21: 1000 mg via INTRAVENOUS
  Filled 2022-11-20: qty 200

## 2022-11-20 MED ORDER — SODIUM CHLORIDE 0.9 % IV SOLN: INTRAVENOUS | Status: DC

## 2022-11-20 MED ORDER — MIDODRINE HCL 5 MG PO TABS
5.0000 mg | ORAL_TABLET | Freq: Three times a day (TID) | ORAL | Status: DC
  Administered 2022-11-20 (×2): 5 mg via ORAL
  Filled 2022-11-20 (×2): qty 1

## 2022-11-20 MED ORDER — SODIUM CHLORIDE 0.9 % IV SOLN
2.0000 g | Freq: Two times a day (BID) | INTRAVENOUS | Status: DC
  Administered 2022-11-20 – 2022-11-21 (×3): 2 g via INTRAVENOUS
  Filled 2022-11-20 (×3): qty 12.5

## 2022-11-20 NOTE — Progress Notes (Signed)
Initial Nutrition Assessment  DOCUMENTATION CODES:   Not applicable  INTERVENTION:  Multivitamin w/ minerals daily Ensure Enlive po BID, each supplement provides 350 kcal and 20 grams of protein. Liberalize diet to regular due to increased needs for wound healing.  Meal ordering with assist Recommend SLP evaluation  NUTRITION DIAGNOSIS:  Increased nutrient needs related to acute illness as evidenced by estimated needs.  GOAL:  Patient will meet greater than or equal to 90% of their needs  MONITOR:  PO intake, Supplement acceptance, Labs, Skin, I & O's  REASON FOR ASSESSMENT:  Consult Assessment of nutrition requirement/status  ASSESSMENT:  72 y.o. female presented to the ED with LLE swelling. PMH includes COPD and hypothyroidism. Pt admitted with sepsis 2/2 LLE cellulitis, AKI and dehydration.  Pt sitting in bed. Reports that she just finished her breakfast, ate some but not everything. Reports that appetite is fairly well, would say she is eating at baseline. Shares that at home she eats two meals per day, lunch out and then dinner at home. Does not eat breakfast because has to take her medication on an empty stomach. She will occasionally drink an Ensure but not regularly. RD discussed using Ensure when PO intake is poor and increased needs for healing; pt was agreeable and prefers the chocolate flavor.  Pt unable to provide UBW, but denies any recent weight loss. Reports using a walker to ambulate at baseline.   Pt shared with RD difficulty swallowing for the past few days. RD informed MD of pt report.   Medications reviewed and include: IV antibiotics  Labs reviewed: Sodium 133, Potassium 4.1    NUTRITION - FOCUSED PHYSICAL EXAM:  Flowsheet Row Most Recent Value  Orbital Region No depletion  Upper Arm Region No depletion  Thoracic and Lumbar Region No depletion  Buccal Region No depletion  Temple Region No depletion  Clavicle Bone Region No depletion  Clavicle and  Acromion Bone Region No depletion  Scapular Bone Region No depletion  Dorsal Hand No depletion  Patellar Region No depletion  Anterior Thigh Region No depletion  Posterior Calf Region Mild depletion  Edema (RD Assessment) Mild  Hair Reviewed  Eyes Reviewed  Mouth Reviewed  Skin Reviewed  Nails Reviewed   Diet Order:   Diet Order             Diet regular Room service appropriate? Yes with Assist; Fluid consistency: Thin  Diet effective now                  EDUCATION NEEDS:  No education needs have been identified at this time  Skin:  Skin Assessment: Reviewed RN Assessment  Last BM:  6/24  Height:  Ht Readings from Last 1 Encounters:  11/19/22 4\' 9"  (1.448 m)   Weight:  Wt Readings from Last 1 Encounters:  11/19/22 67.1 kg   BMI:  Body mass index is 32.03 kg/m.  Estimated Nutritional Needs:  Kcal:  1700-1900 Protein:  85-105 grams Fluid:  >/= 1.7 L   Kirby Crigler RD, LDN Clinical Dietitian See [facility] for contact information.

## 2022-11-20 NOTE — Progress Notes (Signed)
Pharmacy Antibiotic Note  Catherine Wallace is a 72 y.o. female for which pharmacy has been consulted for cefepime and vancomycin dosing for sepsis 2/2 LLE cellulitis.  Patient with a history of mild cognitive impairment, COPD, venous insufficiency w/ chronically bilateral LE edema. Patient presenting with concern for cellulitis not responding to outpatient antibiotics.  SCr trending down to 1 - baseline ~0.8 WBC trending down to 7.7; LA trending down to 1.1; patient remains afebrile  Plan: Change Cefepime to 2g IV q12h  Change Vancomycin to 1000mg  IV q48h (eAUC ~501)    > Goal AUC 400-550    > Check vancomycin levels at steady state  Monitor daily CBC, temp, SCr, and for clinical signs of improvement  F/u cultures and de-escalate antibiotics as able   Height: 4\' 9"  (144.8 cm) Weight: 67.1 kg (148 lb) IBW/kg (Calculated) : 38.6  Temp (24hrs), Avg:98.6 F (37 C), Min:98.2 F (36.8 C), Max:99.3 F (37.4 C)  Recent Labs  Lab 11/19/22 1847 11/19/22 1951 11/19/22 2217 11/20/22 0124 11/20/22 0800  WBC  --  9.7  --   --  7.7  CREATININE 1.44*  --   --   --  1.00  LATICACIDVEN  --  3.1* 2.5* 1.1  --      Estimated Creatinine Clearance: 39.5 mL/min (by C-G formula based on SCr of 1 mg/dL).    Allergies  Allergen Reactions   Tape Rash   Penicillins Nausea And Vomiting   Amoxicillin Rash   Hydrocodone Rash   Sulfamethoxazole-Trimethoprim Rash   Microbiology results: 6/24 Blood cx x2: NGTD <12h 6/24 MRSA PCR: not detected  Thank you for allowing pharmacy to be a part of this patient's care.  Wilburn Cornelia, PharmD, BCPS Clinical Pharmacist 11/20/2022 9:44 AM   Please refer to [facility] for pharmacy phone number

## 2022-11-20 NOTE — Progress Notes (Signed)
Occupational Therapy Treatment Patient Details Name: Catherine Wallace MRN: [id number] DOB: 1951-02-14 Today's Date: 11/20/2022   History of present illness 72 y.o. female presents to [facility] hospital on 11/19/2022 with LLE swelling, edema and drainage. PMH includes COPD, chronic venous insufficiency, chronic back pain, hypothyroidism.   OT comments  Pt evaluated s/p above admission list. Pt reports modified independence with ADL/IADLs, driving and functional mobility with use of RW at baseline. Pt presents this session with BLE and back pain, generalized weakness, decreased activity tolerance, and swelling/erythema noted to BLE/BUEs with RN aware. Pt currently requires setup A for seated UB ADLs and max A for LB ADLs secondary to BLE pain and swelling. Pt completed STS transfer from EOB using RW with min A +2 and step pivot to recliner with min guard A. Pt educated on importance of daily out of bed activity with pt verbalizing understanding. Pt would benefit from continued acute OT services to maximize functional independence and facilitate transition to skilled inpatient follow up therapy, <3 hours/day. Pt may progress to home with confirmed 24/7 caregiver assist.   Vitals: SpO2 >92% on 2L [place]; BP in supine: 94/56, BP sitting EOB: 95/78, RN present/aware    Recommendations for follow up therapy are one component of a multi-disciplinary discharge planning process, led by the attending physician.  Recommendations may be updated based on patient status, additional functional criteria and insurance authorization.    Assistance Recommended at Discharge Frequent or constant Supervision/Assistance  Patient can return home with the following  A little help with walking and/or transfers;A lot of help with bathing/dressing/bathroom;Assistance with cooking/housework;Assist for transportation;Help with stairs or ramp for entrance   Equipment Recommendations  Other (comment) (defer)    Recommendations for Other Services       Precautions / Restrictions Precautions Precautions: Fall Restrictions Weight Bearing Restrictions: No       Mobility Bed Mobility Overal bed mobility: Needs Assistance Bed Mobility: Supine to Sit     Supine to sit: Max assist, +2 for safety/equipment, +2 for physical assistance, HOB elevated     General bed mobility comments: sup>sit with max A +2 secondary to back pain, anticipate pt could do more with pain managed    Transfers Overall transfer level: Needs assistance Equipment used: Rolling walker (2 wheels) Transfers: Sit to/from Stand, Bed to chair/wheelchair/BSC Sit to Stand: Min assist, +2 physical assistance, +2 safety/equipment Stand pivot transfers: Min guard         General transfer comment: STS transfer from EOB using RW with min A +2, step pivot from EOB>recliner with min guard A     Balance Overall balance assessment: Needs assistance Sitting-balance support: No upper extremity supported, Feet unsupported Sitting balance-Leahy Scale: Fair Sitting balance - Comments: sitting EOB   Standing balance support: Bilateral upper extremity supported, Reliant on assistive device for balance Standing balance-Leahy Scale: Poor Standing balance comment: BUE support on RW for stability                           ADL either performed or assessed with clinical judgement   ADL Overall ADL's : Needs assistance/impaired Eating/Feeding: Set up;Sitting   Grooming: Set up;Sitting   Upper Body Bathing: Set up;Sitting   Lower Body Bathing: Moderate assistance;Sit to/from stand   Upper Body Dressing : Set up;Sitting   Lower Body Dressing: Sit to/from stand;Maximal assistance Lower Body Dressing Details (indicate cue type and reason): donned bilat socks at bed level with total A 2/2 BLE  pain, swelling Toilet Transfer: Minimal assistance;+2 for physical assistance;+2 for safety/equipment;BSC/3in1;Rolling walker (2 wheels) Toilet Transfer Details (indicate  cue type and reason): simulated Toileting- Clothing Manipulation and Hygiene: Maximal assistance;Sit to/from stand       Functional mobility during ADLs: Minimal assistance;Rolling walker (2 wheels) General ADL Comments: limited secondary to BLE and back pain, BLE swelling    Extremity/Trunk Assessment Upper Extremity Assessment Upper Extremity Assessment: Generalized weakness   Lower Extremity Assessment Lower Extremity Assessment: Defer to PT evaluation   Cervical / Trunk Assessment Cervical / Trunk Assessment: Normal    Vision Baseline Vision/History: 1 Wears glasses Ability to See in Adequate Light: 0 Adequate Vision Assessment?: No apparent visual deficits   Perception     Praxis      Cognition Arousal/Alertness: Awake/alert Behavior During Therapy: WFL for tasks assessed/performed Overall Cognitive Status: Within Functional Limits for tasks assessed                                 General Comments: A+O x4, pleasant and cooperative        Exercises      Shoulder Instructions       General Comments SpO2 >92% on 2L [place]; BP in supine: 94/56, BP sitting EOB: 95/78, RN present/aware; BUE/BLE swelling and erythema, chest/back rash noted; son present at end of session    Pertinent Vitals/ Pain       Pain Assessment Pain Assessment: Faces Faces Pain Scale: Hurts whole lot Pain Location: back, BLEs Pain Descriptors / Indicators: Discomfort, Grimacing Pain Intervention(s): Limited activity within patient's tolerance, Monitored during session  Home Living Family/patient expects to be discharged to:: Private residence Living Arrangements: Alone Available Help at Discharge: Neighbor;Available PRN/intermittently Type of Home: Apartment Home Access: Level entry     Home Layout: One level     Bathroom Shower/Tub: Chief Strategy Officer: Handicapped height     Home Equipment: Agricultural consultant (2 wheels);Rollator (4 wheels);Wheelchair -  manual;Shower seat;Grab bars - tub/shower   Additional Comments: has intermittent assist available from friends/neighbors      Prior Functioning/Environment              Frequency  Min 2X/week        Progress Toward Goals  OT Goals(current goals can now be found in the care plan section)     Acute Rehab OT Goals Patient Stated Goal: to rest OT Goal Formulation: With patient Time For Goal Achievement: 12/04/22 Potential to Achieve Goals: Good ADL Goals Pt Will Perform Grooming: with modified independence;standing Pt Will Perform Upper Body Bathing: with modified independence;sitting Pt Will Perform Lower Body Dressing: with min guard assist;sit to/from stand Pt Will Transfer to Toilet: with modified independence;bedside commode;stand pivot transfer Additional ADL Goal #1: Pt will complete bed mobility with min A in preparation for ADLs.  Plan      Co-evaluation                 AM-PAC OT "6 Clicks" Daily Activity     Outcome Measure   Help from another person eating meals?: A Little Help from another person taking care of personal grooming?: A Little Help from another person toileting, which includes using toliet, bedpan, or urinal?: A Little Help from another person bathing (including washing, rinsing, drying)?: A Lot Help from another person to put on and taking off regular upper body clothing?: A Little Help from another person to put on  and taking off regular lower body clothing?: A Lot 6 Click Score: 16    End of Session Equipment Utilized During Treatment: Rolling walker (2 wheels);Gait belt;Oxygen  OT Visit Diagnosis: Unsteadiness on feet (R26.81);Muscle weakness (generalized) (M62.81);Pain   Activity Tolerance No increased pain   Patient Left in chair;with call bell/phone within reach;with chair alarm set;with family/visitor present   Nurse Communication Mobility status        Time: 1550-1611 OT Time Calculation (min): 21 min  Charges:  OT General Charges $OT Visit: 1 Visit OT Evaluation $OT Eval Moderate Complexity: 1 Mod  Sherley Bounds, OTS Acute Rehabilitation Services Office [phone] Secure Chat Communication Preferred   Sherley Bounds 11/20/2022, 4:42 PM

## 2022-11-20 NOTE — Consult Note (Signed)
NAME:  Catherine Wallace, MRN:  [id number], DOB:  13-Oct-1950, LOS: 1 ADMISSION DATE:  11/19/2022, CONSULTATION DATE: 11/20/2022 REFERRING MD: [facility], CHIEF COMPLAINT: Lower extremity cellulitis  History of Present Illness:  72 year old female who was treated as an outpatient with antimicrobial therapy for lower extremity cellulitis which was marked at the time she was seen at urgent care.  She now presents with spread of her cellulitis of the left lower extremity outside the markings that were applied at urgent care.  She has been treated with aggressive antimicrobial therapy.  Currently she is hemodynamically stable.  Her lactic acid is 1.1.  Pulmonary critical care has been asked to evaluate.  At this time she can be admitted to stepdown unit and to [facility] hospitalist service should she worsen pulmonary critical care will be able to become more involved.  Note that she is a DNR.  Pertinent  Medical History  History reviewed. No pertinent past medical history. COPD Extremity cellulitis Renal insufficiency  Significant Hospital Events: Including procedures, antibiotic start and stop dates in addition to other pertinent events     Interim History / Subjective:  Admitted with nonhealing lower extremity wounds despite being treated with antibiotics for 2 weeks as an outpatient  Objective   Blood pressure (!) 90/51, pulse 69, temperature 98.6 F (37 C), temperature source Oral, resp. rate 14, height 4\' 9"  (1.448 m), weight 67.1 kg, SpO2 96 %.        Intake/Output Summary (Last 24 hours) at 11/20/2022 0944 Last data filed at 11/20/2022 1191 Gross per 24 hour  Intake 2298 ml  Output --  Net 2298 ml   Filed Weights   11/19/22 2046  Weight: 67.1 kg    Examination: General: Well-nourished well-developed female no acute distress at rest HENT: No JVD or lymphadenopathy is appreciated Lungs: Decreased air movement without wheeze or rhonchi Cardiovascular: Heart sounds are regular regular rate and  rhythm sinus rhythm at 86 Abdomen: Soft nontender positive bowel sounds Extremities: Bilateral lower extremities with vascular changes cellulitis left greater than right Neuro: Somewhat dull affect but intact otherwise  GU: Voids  Resolved Hospital Problem list     Assessment & Plan:  Periods of hypotension in the setting of lower extremity infection cellulitis left greater than right ankle with normal lactic acid and normal white count. Current systolic pressure is 99 Agree with antimicrobial therapy consider infectious disease consult Agree with vascular studies May need vascular surgery intervention in the future Gentle fluid resuscitation  Admit to stepdown unit and to [facility] service at this time Procalcitonin Note she continues to smoke  COPD and lifelong smoker. Smoking cessation Bronchodilator  Hypothyroidism   History of renal insufficiency Lab Results  Component Value Date   CREATININE 1.00 11/20/2022   CREATININE 1.44 (H) 11/19/2022   CREATININE 0.56 04/01/2019   Avoid nephrotoxins [facility] (right click and "Reselect all SmartList Selections" daily)   Diet/type: Regular consistency (see orders) DVT prophylaxis:  GI prophylaxis: PPI Lines: N/A Foley:  N/A Code Status:  DNR Last date of multidisciplinary goals of care discussion [tbd]  Labs   CBC: Recent Labs  Lab 11/19/22 1951 11/19/22 2325 11/20/22 0800  WBC 9.7  --  7.7  NEUTROABS 8.3*  --   --   HGB 14.4 13.3 12.8  HCT 43.2 39.0 37.5  MCV 92.7  --  94.0  PLT 212  --  175    Basic Metabolic Panel: Recent Labs  Lab 11/19/22 1847 11/19/22 2315 11/19/22 2325 11/20/22 0800  NA 133*  --  131* 133*  K 4.4  --  4.1 4.1  CL 98  --   --  99  CO2 19*  --   --  25  GLUCOSE 161*  --   --  94  BUN 26*  --   --  17  CREATININE 1.44*  --   --  1.00  CALCIUM 9.4  --   --  8.1*  MG  --  1.6*  --  1.8  PHOS  --  2.4*  --  2.9   GFR: Estimated Creatinine Clearance: 39.5 mL/min (by C-G formula  based on SCr of 1 mg/dL). Recent Labs  Lab 11/19/22 1951 11/19/22 2217 11/19/22 2315 11/20/22 0124 11/20/22 0800  PROCALCITON  --   --  0.16  --   --   WBC 9.7  --   --   --  7.7  LATICACIDVEN 3.1* 2.5*  --  1.1  --     Liver Function Tests: Recent Labs  Lab 11/19/22 1847 11/20/22 0800  AST 30 45*  ALT 14 18  ALKPHOS 107 78  BILITOT 0.5 0.5  PROT 6.6 5.1*  ALBUMIN 3.3* 2.6*   No results for input(s): "LIPASE", "AMYLASE" in the last 168 hours. No results for input(s): "AMMONIA" in the last 168 hours.  ABG    Component Value Date/Time   HCO3 27.9 11/20/2022 0045   TCO2 28 11/19/2022 2325   O2SAT 90.4 11/20/2022 0045     Coagulation Profile: Recent Labs  Lab 11/19/22 2315  INR 1.1    Cardiac Enzymes: Recent Labs  Lab 11/19/22 2315  CKTOTAL 155    HbA1C: No results found for: "HGBA1C"  CBG: No results for input(s): "GLUCAP" in the last 168 hours.  Review of Systems:   10 point review of system taken, please see HPI for positives and negatives.   Past Medical History:  She,  has no past medical history on file.   Surgical History:  History reviewed. No pertinent surgical history.   Social History:   reports that she has been smoking. She has never used smokeless tobacco.   Family History:  Her family history is not on file.   Allergies Allergies  Allergen Reactions   Tape Rash   Penicillins Nausea And Vomiting   Amoxicillin Rash   Hydrocodone Rash   Sulfamethoxazole-Trimethoprim Rash     Home Medications  Prior to Admission medications   Medication Sig Start Date End Date Taking? Authorizing Provider  carboxymethylcellulose (REFRESH PLUS) 0.5 % SOLN Place 2 drops into both eyes 3 (three) times daily.    Yes [provider]  cephALEXin (KEFLEX) 250 MG capsule Take 250 mg by mouth 4 (four) times daily.   Yes [provider]  Cholecalciferol (VITAMIN D) 50 MCG (2000 UT) tablet Take 2,000 Units by mouth daily.   Yes  [provider]  furosemide (LASIX) 20 MG tablet Take 10 mg by mouth every other day. 04/25/22  Yes [provider]  levothyroxine (SYNTHROID) 25 MCG tablet Take 25 mcg by mouth daily before breakfast.   Yes [provider]  loratadine (CLARITIN) 10 MG tablet Take 10 mg by mouth daily as needed for allergies.   Yes [provider]  Multiple Vitamins-Minerals (ALIVE WOMENS 50+ COMPLETE MV PO) Take 1 tablet by mouth daily.   Yes [provider]  oxybutynin (DITROPAN-XL) 5 MG 24 hr tablet Take 5 mg by mouth daily. 12/11/18  Yes [provider]  Critical care time: 35 min    Brett Canales [place] ACNP Acute Care Nurse Practitioner [facility] Pul[facility]/Critical Care Please consult Amion 11/20/2022, 9:44 AM

## 2022-11-20 NOTE — Evaluation (Signed)
Physical Therapy Evaluation Patient Details Name: Catherine Wallace MRN: [id number] DOB: July 24, 1950 Today's Date: 11/20/2022  History of Present Illness  72 y.o. female presents to [facility] hospital on 11/19/2022 with LLE swelling, edema and drainage. PMH includes COPD, chronic venous insufficiency, chronic back pain, hypothyroidism.  Clinical Impression  Pt presents to PT with deficits in functional mobility, gait, balance, strength, power. Pt with significant LE swelling and erythema. Pt reports generalized weakness, affecting her ability to mobilize on the stretcher. Pt declines attempts at standing due to weakness and fear of falling, unable to reach feet to floor from tall stretcher height. PT will continue to follow in an effort to progress mobility. PT recommends short term inpatient PT services as the pt has limited caregiver support.        Recommendations for follow up therapy are one component of a multi-disciplinary discharge planning process, led by the attending physician.  Recommendations may be updated based on patient status, additional functional criteria and insurance authorization.  Follow Up Recommendations Can patient physically be transported by private vehicle: No     Assistance Recommended at Discharge Intermittent Supervision/Assistance  Patient can return home with the following  A lot of help with walking and/or transfers;A lot of help with bathing/dressing/bathroom;Assistance with cooking/housework;Assist for transportation;Help with stairs or ramp for entrance    Equipment Recommendations  (TBD pending progress)  Recommendations for Other Services       Functional Status Assessment Patient has had a recent decline in their functional status and demonstrates the ability to make significant improvements in function in a reasonable and predictable amount of time.     Precautions / Restrictions Precautions Precautions: Fall Restrictions Weight Bearing Restrictions: No       Mobility  Bed Mobility Overal bed mobility: Needs Assistance Bed Mobility: Supine to Sit, Sit to Supine, Rolling Rolling: Min guard   Supine to sit: Mod assist, HOB elevated Sit to supine: Mod assist        Transfers Overall transfer level:  (pt defers transfers due to weakness, also unable to reach feet to floor from stretcher)                      Ambulation/Gait                  Stairs            Wheelchair Mobility    Modified Rankin (Stroke Patients Only)       Balance Overall balance assessment: Needs assistance Sitting-balance support: No upper extremity supported, Feet unsupported Sitting balance-Leahy Scale: Fair                                       Pertinent Vitals/Pain Pain Assessment Pain Assessment: 0-10 Pain Score: 8  Pain Location: back Pain Descriptors / Indicators: Aching Pain Intervention(s): Monitored during session    Home Living Family/patient expects to be discharged to:: Private residence Living Arrangements: Alone Available Help at Discharge: Neighbor;Available PRN/intermittently Type of Home: Apartment Home Access: Level entry       Home Layout: One level Home Equipment: Agricultural consultant (2 wheels);Rollator (4 wheels);Wheelchair - manual;Shower seat;Grab bars - tub/shower      Prior Function Prior Level of Function : Independent/Modified Independent;Driving             Mobility Comments: ambulates with RW       Hand Dominance  Extremity/Trunk Assessment   Upper Extremity Assessment Upper Extremity Assessment: Generalized weakness    Lower Extremity Assessment Lower Extremity Assessment: Generalized weakness;LLE deficits/detail;RLE deficits/detail RLE Deficits / Details: BLE errythema, LLE>RLE, weeping from LLE LLE Deficits / Details: BLE errythema, LLE>RLE, weeping from LLE    Cervical / Trunk Assessment Cervical / Trunk Assessment: Normal  Communication    Communication: No difficulties  Cognition Arousal/Alertness: Awake/alert Behavior During Therapy: WFL for tasks assessed/performed Overall Cognitive Status: Within Functional Limits for tasks assessed                                          General Comments General comments (skin integrity, edema, etc.): hypotensive this AM, BP 91/44    Exercises     Assessment/Plan    PT Assessment Patient needs continued PT services  PT Problem List Decreased strength;Decreased activity tolerance;Decreased balance;Decreased mobility;Decreased knowledge of use of DME;Pain;Cardiopulmonary status limiting activity       PT Treatment Interventions DME instruction;Gait training;Functional mobility training;Therapeutic activities;Therapeutic exercise;Balance training;Neuromuscular re-education;Patient/family education;Wheelchair mobility training    PT Goals (Current goals can be found in the Care Plan section)  Acute Rehab PT Goals Patient Stated Goal: to regain strength, return to independence PT Goal Formulation: With patient Time For Goal Achievement: 12/04/22 Potential to Achieve Goals: Fair    Frequency Min 2X/week     Co-evaluation               AM-PAC PT "6 Clicks" Mobility  Outcome Measure Help needed turning from your back to your side while in a flat bed without using bedrails?: A Little Help needed moving from lying on your back to sitting on the side of a flat bed without using bedrails?: A Lot Help needed moving to and from a bed to a chair (including a wheelchair)?: Total Help needed standing up from a chair using your arms (e.g., wheelchair or bedside chair)?: Total Help needed to walk in hospital room?: Total Help needed climbing 3-5 steps with a railing? : Total 6 Click Score: 9    End of Session   Activity Tolerance: Patient limited by fatigue Patient left: in bed;with call bell/phone within reach Nurse Communication: Mobility status PT Visit  Diagnosis: Other abnormalities of gait and mobility (R26.89);Muscle weakness (generalized) (M62.81)    Time: 1610-9604 PT Time Calculation (min) (ACUTE ONLY): 23 min   Charges:   PT Evaluation $PT Eval Low Complexity: 1 Low          Arlyss Gandy, PT, DPT Acute Rehabilitation Office [phone]   Arlyss Gandy 11/20/2022, 10:36 AM

## 2022-11-20 NOTE — ED Notes (Signed)
ED TO INPATIENT HANDOFF REPORT  ED Nurse Name and Phone #: Nicholos Johns [phone]  S Name/Age/Gender Catherine Wallace 72 y.o. female Room/Bed: 003C/003C  Code Status   Code Status: DNR  Home/SNF/Other Rehab Patient oriented to: self, place, time, and situation Is this baseline? Yes   Triage Complete: Triage complete  Chief Complaint Cellulitis [L03.90]  Triage Note Pt with redness/significant swelling to left lower leg and foot.  States 2 days ago a line was drawn around an area and was told if the redness spread to come back in.  She states " I don't like hospitals" so she didn't seek care until today even though the redness and swelling have significantly spread.  Pt has pitting edema, copious drainage of leg and foot.  Feels warm to touch in areas and cool in others.     Allergies Allergies  Allergen Reactions   Tape Rash   Penicillins Nausea And Vomiting   Amoxicillin Rash   Hydrocodone Rash   Sulfamethoxazole-Trimethoprim Rash    Level of Care/Admitting Diagnosis ED Disposition     ED Disposition  Admit   Condition  --   Comment  Hospital Area: [facility] [[id number]]  Level of Care: Progressive [102]  Admit to Progressive based on following criteria: MULTISYSTEM THREATS such as stable sepsis, metabolic/electrolyte imbalance with or without encephalopathy that is responding to early treatment.  May admit patient to [facility] or [facility] if equivalent level of care is available:: No  Covid Evaluation: Asymptomatic - no recent exposure (last 10 days) testing not required  Diagnosis: Cellulitis [454098]  Admitting Physician: Therisa Doyne [[id number]]  Attending Physician: Therisa Doyne [[id number]]  Certification:: I certify this patient will need inpatient services for at least 2 midnights  Estimated Length of Stay: 2          B Medical/Surgery History History reviewed. No pertinent past medical history. History reviewed. No pertinent surgical  history.   A IV Location/Drains/Wounds Patient Lines/Drains/Airways Status     Active Line/Drains/Airways     Name Placement date Placement time Site Days   Peripheral IV 11/19/22 20 G 1" Right Antecubital 11/19/22  2031  Antecubital  1   Pressure Injury 03/04/19 Buttocks Left Stage II -  Partial thickness loss of dermis presenting as a shallow open ulcer with a red, pink wound bed without slough. 03/04/19  1900  -- 1357   Pressure Injury 03/04/19 Thigh Left;Posterior;Proximal Stage II -  Partial thickness loss of dermis presenting as a shallow open ulcer with a red, pink wound bed without slough. 03/04/19  1900  -- 1357   Pressure Injury 03/04/19 Buttocks Right 03/04/19  1900  -- 1357   Wound / Incision (Open or Dehisced) 03/07/19 Other (Comment) Leg Left 03/07/19  1702  Leg  1354            Intake/Output Last 24 hours  Intake/Output Summary (Last 24 hours) at 11/20/2022 1212 Last data filed at 11/20/2022 0640 Gross per 24 hour  Intake 2298 ml  Output --  Net 2298 ml    Labs/Imaging Results for orders placed or performed during the hospital encounter of 11/19/22 (from the past 48 hour(s))  Comprehensive metabolic panel     Status: Abnormal   Collection Time: 11/19/22  6:47 PM  Result Value Ref Range   Sodium 133 (L) 135 - 145 mmol/L   Potassium 4.4 3.5 - 5.1 mmol/L   Chloride 98 98 - 111 mmol/L   CO2 19 (L) 22 - 32  mmol/L   Glucose, Bld 161 (H) 70 - 99 mg/dL    Comment: Glucose reference range applies only to samples taken after fasting for at least 8 hours.   BUN 26 (H) 8 - 23 mg/dL   Creatinine, Ser 4.09 (H) 0.44 - 1.00 mg/dL   Calcium 9.4 8.9 - 81.1 mg/dL   Total Protein 6.6 6.5 - 8.1 g/dL   Albumin 3.3 (L) 3.5 - 5.0 g/dL   AST 30 15 - 41 U/L   ALT 14 0 - 44 U/L   Alkaline Phosphatase 107 38 - 126 U/L   Total Bilirubin 0.5 0.3 - 1.2 mg/dL   GFR, Estimated 38 (L) >60 mL/min    Comment: (NOTE) Calculated using the CKD-EPI Creatinine Equation (2021)    Anion gap  16 (H) 5 - 15    Comment: ELECTROLYTES REPEATED TO VERIFY Performed at [facility], [address] [place], [place] [address]   CBC with Differential/Platelet     Status: Abnormal   Collection Time: 11/19/22  7:51 PM  Result Value Ref Range   WBC 9.7 4.0 - 10.5 K/uL   RBC 4.66 3.87 - 5.11 MIL/uL   Hemoglobin 14.4 12.0 - 15.0 g/dL   HCT 29.5 62.1 - 30.8 %   MCV 92.7 80.0 - 100.0 fL   MCH 30.9 26.0 - 34.0 pg   MCHC 33.3 30.0 - 36.0 g/dL   RDW 65.7 84.6 - 96.2 %   Platelets 212 150 - 400 K/uL   nRBC 0.0 0.0 - 0.2 %   Neutrophils Relative % 85 %   Neutro Abs 8.3 (H) 1.7 - 7.7 K/uL   Lymphocytes Relative 7 %   Lymphs Abs 0.7 0.7 - 4.0 K/uL   Monocytes Relative 5 %   Monocytes Absolute 0.5 0.1 - 1.0 K/uL   Eosinophils Relative 2 %   Eosinophils Absolute 0.2 0.0 - 0.5 K/uL   Basophils Relative 0 %   Basophils Absolute 0.0 0.0 - 0.1 K/uL   Immature Granulocytes 1 %   Abs Immature Granulocytes 0.06 0.00 - 0.07 K/uL    Comment: Performed at [facility], [address], [place], [place] [address]  Lactic acid, plasma     Status: Abnormal   Collection Time: 11/19/22  7:51 PM  Result Value Ref Range   Lactic Acid, Venous 3.1 (HH) 0.5 - 1.9 mmol/L    Comment: CRITICAL RESULT CALLED TO, READ BACK BY AND VERIFIED WITH M.ALLEN,RN @2036  11/19/2022 VANG.J Performed at [facility], [address] [place], [place] [address]   Blood Culture (routine x 2)     Status: None (Preliminary result)   Collection Time: 11/19/22  8:25 PM   Specimen: BLOOD  Result Value Ref Range   Specimen Description BLOOD RIGHT ANTECUBITAL    Special Requests      BOTTLES DRAWN AEROBIC AND ANAEROBIC Blood Culture results may not be optimal due to an excessive volume of blood received in culture bottles   Culture      NO GROWTH < 12 HOURS Performed at [facility], [address], [place], [place] [address]    Report Status PENDING   Blood Culture (routine x 2)     Status: None  (Preliminary result)   Collection Time: 11/19/22  8:35 PM   Specimen: BLOOD  Result Value Ref Range   Specimen Description BLOOD LEFT ANTECUBITAL    Special Requests      BOTTLES DRAWN AEROBIC AND ANAEROBIC Blood  Culture results may not be optimal due to an inadequate volume of blood received in culture bottles   Culture      NO GROWTH < 12 HOURS Performed at [facility], [address], [place], [place] [address]    Report Status PENDING   Urinalysis, Routine w reflex microscopic -Urine, Clean Catch     Status: Abnormal   Collection Time: 11/19/22 10:02 PM  Result Value Ref Range   Color, Urine YELLOW YELLOW   APPearance CLOUDY (A) CLEAR   Specific Gravity, Urine >1.030 (H) 1.005 - 1.030   pH 6.0 5.0 - 8.0   Glucose, UA NEGATIVE NEGATIVE mg/dL   Hgb urine dipstick NEGATIVE NEGATIVE   Bilirubin Urine SMALL (A) NEGATIVE   Ketones, ur NEGATIVE NEGATIVE mg/dL   Protein, ur NEGATIVE NEGATIVE mg/dL   Nitrite NEGATIVE NEGATIVE   Leukocytes,Ua TRACE (A) NEGATIVE    Comment: Performed at [facility], [address], [place], [place] [address]  Urinalysis, Microscopic (reflex)     Status: Abnormal   Collection Time: 11/19/22 10:02 PM  Result Value Ref Range   RBC / HPF 0-5 0 - 5 RBC/hpf   WBC, UA 0-5 0 - 5 WBC/hpf   Bacteria, UA FEW (A) NONE SEEN   Squamous Epithelial / HPF 0-5 0 - 5 /HPF   Amorphous Crystal PRESENT    Urine-Other MICROSCOPIC EXAM PERFORMED ON UNCONCENTRATED URINE     Comment: LESS THAN 10 mL OF URINE SUBMITTED Performed at [facility], [address] [place], [place] [address]   Lactic acid, plasma     Status: Abnormal   Collection Time: 11/19/22 10:17 PM  Result Value Ref Range   Lactic Acid, Venous 2.5 (HH) 0.5 - 1.9 mmol/L    Comment: CRITICAL VALUE NOTED. VALUE IS CONSISTENT WITH PREVIOUSLY REPORTED/CALLED VALUE Performed at [facility], [address] [place], [place] [address]   Protime-INR     Status: None   Collection Time:  11/19/22 11:15 PM  Result Value Ref Range   Prothrombin Time 14.4 11.4 - 15.2 seconds   INR 1.1 0.8 - 1.2    Comment: (NOTE) INR goal varies based on device and disease states. Performed at [facility] Lab, [address], [place], [place] [address]   APTT     Status: None   Collection Time: 11/19/22 11:15 PM  Result Value Ref Range   aPTT 29 24 - 36 seconds    Comment: Performed at [facility], [address], [place], [place] [address]  Procalcitonin     Status: None   Collection Time: 11/19/22 11:15 PM  Result Value Ref Range   Procalcitonin 0.16 ng/mL    Comment:        Interpretation: PCT (Procalcitonin) <= 0.5 ng/mL: Systemic infection (sepsis) is not likely. Local bacterial infection is possible. (NOTE)       Sepsis PCT Algorithm           Lower Respiratory Tract                                      Infection PCT Algorithm    ----------------------------     ----------------------------         PCT < 0.25 ng/mL                PCT < 0.10 ng/mL  Strongly encourage             Strongly discourage   discontinuation of antibiotics    initiation of antibiotics    ----------------------------     -----------------------------       PCT 0.25 - 0.50 ng/mL            PCT 0.10 - 0.25 ng/mL               OR       >80% decrease in PCT            Discourage initiation of                                            antibiotics      Encourage discontinuation           of antibiotics    ----------------------------     -----------------------------         PCT >= 0.50 ng/mL              PCT 0.26 - 0.50 ng/mL               AND        <80% decrease in PCT             Encourage initiation of                                             antibiotics       Encourage continuation           of antibiotics    ----------------------------     -----------------------------        PCT >= 0.50 ng/mL                  PCT > 0.50 ng/mL               AND         increase in PCT                   Strongly encourage                                      initiation of antibiotics    Strongly encourage escalation           of antibiotics                                     -----------------------------                                           PCT <= 0.25 ng/mL                                                 OR                                        >  80% decrease in PCT                                      Discontinue / Do not initiate                                             antibiotics  Performed at [facility], [address] [place], [place] [address]   Osmolality     Status: None   Collection Time: 11/19/22 11:15 PM  Result Value Ref Range   Osmolality 285 275 - 295 mOsm/kg    Comment: Performed at [facility], [address] [place], [place] [address]  Magnesium     Status: Abnormal   Collection Time: 11/19/22 11:15 PM  Result Value Ref Range   Magnesium 1.6 (L) 1.7 - 2.4 mg/dL    Comment: Performed at [facility], [address], [place], [place] [address]  Phosphorus     Status: Abnormal   Collection Time: 11/19/22 11:15 PM  Result Value Ref Range   Phosphorus 2.4 (L) 2.5 - 4.6 mg/dL    Comment: Performed at [facility], [address], [place], [place] [address]  CK     Status: None   Collection Time: 11/19/22 11:15 PM  Result Value Ref Range   Total CK 155 38 - 234 U/L    Comment: Performed at [facility], [address], [place], [place] [address]  TSH     Status: None   Collection Time: 11/19/22 11:15 PM  Result Value Ref Range   TSH 1.049 0.350 - 4.500 uIU/mL    Comment: Performed by a 3rd Generation assay with a functional sensitivity of <=0.01 uIU/mL. Performed at [facility], [address], [place], [place] [address]   I-Stat venous blood gas, ED     Status: Abnormal   Collection Time: 11/19/22 11:25 PM  Result Value Ref Range   pH, Ven 7.417 7.25 - 7.43   pCO2, Ven 41.9 (L) 44 - 60 mmHg    pO2, Ven 38 32 - 45 mmHg   Bicarbonate 27.0 20.0 - 28.0 mmol/L   TCO2 28 22 - 32 mmol/L   O2 Saturation 73 %   Acid-Base Excess 2.0 0.0 - 2.0 mmol/L   Sodium 131 (L) 135 - 145 mmol/L   Potassium 4.1 3.5 - 5.1 mmol/L   Calcium, Ion 1.10 (L) 1.15 - 1.40 mmol/L   HCT 39.0 36.0 - 46.0 %   Hemoglobin 13.3 12.0 - 15.0 g/dL   Sample type VENOUS    Comment NOTIFIED PHYSICIAN   Creatinine, urine, random     Status: None   Collection Time: 11/20/22 12:45 AM  Result Value Ref Range   Creatinine, Urine 102 mg/dL    Comment: Performed at [facility], [address] [place], [place] [address]  Osmolality, urine     Status: None   Collection Time: 11/20/22 12:45 AM  Result Value Ref Range   Osmolality, Ur 554 300 - 900 mOsm/kg    Comment: Performed at [facility], [address] [place], [place] [address]  Sodium, urine, random     Status: None   Collection Time: 11/20/22 12:45 AM  Result Value Ref Range  Sodium, Ur 13 mmol/L    Comment: Performed at [facility], [address] [place], [place] [address]  MRSA Next Gen by PCR, Nasal     Status: None   Collection Time: 11/20/22 12:45 AM   Specimen: Nasal Mucosa; Nasal Swab  Result Value Ref Range   MRSA by PCR Next Gen NOT DETECTED NOT DETECTED    Comment: (NOTE) The GeneXpert MRSA Assay (FDA approved for NASAL specimens only), is one component of a comprehensive MRSA colonization surveillance program. It is not intended to diagnose MRSA infection nor to guide or monitor treatment for MRSA infections. Test performance is not FDA approved in patients less than 14 years old. Performed at [facility], [address], [place], [place] [address]   Blood gas, venous     Status: Abnormal   Collection Time: 11/20/22 12:45 AM  Result Value Ref Range   pH, Ven 7.4 7.25 - 7.43   pCO2, Ven 45 44 - 60 mmHg   pO2, Ven 56 (H) 32 - 45 mmHg   Bicarbonate 27.9 20.0 - 28.0 mmol/L   Acid-Base Excess 2.5 (H) 0.0 - 2.0  mmol/L   O2 Saturation 90.4 %   Patient temperature 37.0    Collection site VEIN     Comment: Performed at [facility], [address], [place], [place] [address]  Lactic acid, plasma     Status: None   Collection Time: 11/20/22  1:24 AM  Result Value Ref Range   Lactic Acid, Venous 1.1 0.5 - 1.9 mmol/L    Comment: Performed at [facility], [address], [place], [place] [address]  Magnesium     Status: None   Collection Time: 11/20/22  8:00 AM  Result Value Ref Range   Magnesium 1.8 1.7 - 2.4 mg/dL    Comment: Performed at [facility], [address], [place], [place] [address]  Phosphorus     Status: None   Collection Time: 11/20/22  8:00 AM  Result Value Ref Range   Phosphorus 2.9 2.5 - 4.6 mg/dL    Comment: Performed at [facility], [address] [place], [place] [address]  CBC     Status: None   Collection Time: 11/20/22  8:00 AM  Result Value Ref Range   WBC 7.7 4.0 - 10.5 K/uL   RBC 3.99 3.87 - 5.11 MIL/uL   Hemoglobin 12.8 12.0 - 15.0 g/dL   HCT 57.8 46.9 - 62.9 %   MCV 94.0 80.0 - 100.0 fL   MCH 32.1 26.0 - 34.0 pg   MCHC 34.1 30.0 - 36.0 g/dL   RDW 52.8 41.3 - 24.4 %   Platelets 175 150 - 400 K/uL   nRBC 0.0 0.0 - 0.2 %    Comment: Performed at [facility], [address] [place], [place] [address]  Comprehensive metabolic panel     Status: Abnormal   Collection Time: 11/20/22  8:00 AM  Result Value Ref Range   Sodium 133 (L) 135 - 145 mmol/L   Potassium 4.1 3.5 - 5.1 mmol/L   Chloride 99 98 - 111 mmol/L   CO2 25 22 - 32 mmol/L   Glucose, Bld 94 70 - 99 mg/dL    Comment: Glucose reference range applies only to samples taken after fasting for at least 8 hours.   BUN 17 8 - 23 mg/dL   Creatinine, Ser 2.53 0.44 - 1.00 mg/dL   Calcium  8.1 (L) 8.9 - 10.3 mg/dL   Total Protein 5.1 (L) 6.5 - 8.1 g/dL   Albumin 2.6 (L) 3.5 - 5.0 g/dL   AST 45 (H) 15 - 41 U/L   ALT 18 0 - 44 U/L   Alkaline Phosphatase 78 38 - 126 U/L   Total  Bilirubin 0.5 0.3 - 1.2 mg/dL   GFR, Estimated 59 (L) >60 mL/min    Comment: (NOTE) Calculated using the CKD-EPI Creatinine Equation (2021)    Anion gap 9 5 - 15    Comment: Performed at [facility], [address] [place], [place] [address]   VAS [place] LOWER EXTREMITY VENOUS (DVT) (ONLY [facility] & [facility])  Result Date: 11/20/2022  Lower Venous DVT Study Patient Name:  BAYLIE DRAKES  Date of Exam:   11/20/2022 Medical Rec #: [id number]   Accession #:    [id number] Date of Birth: 06-03-1950    Patient Gender: F Patient Age:   53 years Exam Location:  [facility] Procedure:      VAS [place] LOWER EXTREMITY VENOUS (DVT) Referring Phys: Jonny Ruiz DOUTOVA --------------------------------------------------------------------------------  Indications: Pain, Erythema, Warmth and Edema. (Cellulitis)  Limitations: Body habitus and patient movement. Comparison Study: Previous exams performed at outside facilities - unable to                   view reports. Performing Technologist: Ernestene Mention RVT, RDMS  Examination Guidelines: A complete evaluation includes B-mode imaging, spectral Doppler, color Doppler, and power Doppler as needed of all accessible portions of each vessel. Bilateral testing is considered an integral part of a complete examination. Limited examinations for reoccurring indications may be performed as noted. The reflux portion of the exam is performed with the patient in reverse Trendelenburg.  +---------+---------------+---------+-----------+----------+--------------+ RIGHT    CompressibilityPhasicitySpontaneityPropertiesThrombus Aging +---------+---------------+---------+-----------+----------+--------------+ CFV      Full           Yes      Yes                                 +---------+---------------+---------+-----------+----------+--------------+ SFJ      Full                                                         +---------+---------------+---------+-----------+----------+--------------+ FV Prox  Full           Yes      Yes                                 +---------+---------------+---------+-----------+----------+--------------+ FV Mid   Full           Yes      Yes                                 +---------+---------------+---------+-----------+----------+--------------+ FV DistalFull           Yes      Yes                                 +---------+---------------+---------+-----------+----------+--------------+ PFV  Full                                                        +---------+---------------+---------+-----------+----------+--------------+ POP      Full           Yes      Yes                                 +---------+---------------+---------+-----------+----------+--------------+ PTV      Full                                                        +---------+---------------+---------+-----------+----------+--------------+ PERO     Full                                                        +---------+---------------+---------+-----------+----------+--------------+   +---------+---------------+---------+-----------+----------+-------------------+ LEFT     CompressibilityPhasicitySpontaneityPropertiesThrombus Aging      +---------+---------------+---------+-----------+----------+-------------------+ CFV      Full           Yes      Yes                                      +---------+---------------+---------+-----------+----------+-------------------+ SFJ      Full                                                             +---------+---------------+---------+-----------+----------+-------------------+ FV Prox  Full           Yes      Yes                                      +---------+---------------+---------+-----------+----------+-------------------+ FV Mid   Full           Yes      Yes                                       +---------+---------------+---------+-----------+----------+-------------------+ FV DistalFull           Yes      Yes                                      +---------+---------------+---------+-----------+----------+-------------------+ PFV      Full                                                             +---------+---------------+---------+-----------+----------+-------------------+  POP      Full           Yes      Yes                                      +---------+---------------+---------+-----------+----------+-------------------+ PTV                                                   Not well visualized +---------+---------------+---------+-----------+----------+-------------------+ PERO                                                  Not well visualized +---------+---------------+---------+-----------+----------+-------------------+     Summary: BILATERAL: - No evidence of deep vein thrombosis seen in the lower extremities, bilaterally. -No evidence of popliteal cyst, bilaterally.   *See table(s) above for measurements and observations.    Preliminary    DG Tibia/Fibula Left  Result Date: 11/19/2022 CLINICAL DATA:  Questionable sepsis - evaluate for abnormality EXAM: LEFT TIBIA AND FIBULA - 2 VIEW COMPARISON:  January 28, 2019 FINDINGS: No acute fracture or dislocation. Joint spaces and alignment are maintained. No area of erosion or osseous destruction. No unexpected radiopaque foreign body. Soft tissue edema. IMPRESSION: 1. No acute fracture or dislocation. 2. Soft tissue edema. Electronically Signed   By: Meda Klinefelter M.D.   On: 11/19/2022 21:29   DG Chest Port 1 View  Result Date: 11/19/2022 CLINICAL DATA:  Questionable sepsis - evaluate for abnormality EXAM: PORTABLE CHEST 1 VIEW COMPARISON:  February 17, 2019 FINDINGS: The cardiomediastinal silhouette is unchanged in contour.Atherosclerotic calcifications of the tortuous thoracic aorta. No pleural  effusion. No pneumothorax. No acute pleuroparenchymal abnormality. IMPRESSION: No acute cardiopulmonary abnormality. Electronically Signed   By: Meda Klinefelter M.D.   On: 11/19/2022 21:29    Pending Labs Unresulted Labs (From admission, onward)     Start     Ordered   11/21/22 0500  CBC  Tomorrow morning,   R        11/20/22 0921   11/21/22 0500  Basic metabolic panel  Tomorrow morning,   R        11/20/22 0921            Vitals/Pain Today's Vitals   11/20/22 1030 11/20/22 1042 11/20/22 1045 11/20/22 1048  BP: (!) 81/49  (!) 93/49   Pulse: 63  73   Resp: 10  19   Temp:  98.7 F (37.1 C)    TempSrc:  Oral    SpO2: 94%  99%   Weight:      Height:      PainSc:    6     Isolation Precautions No active isolations  Medications Medications  lactated ringers infusion ( Intravenous New Bag/Given 11/20/22 0641)  levothyroxine (SYNTHROID) tablet 25 mcg (25 mcg Oral Given 11/20/22 0828)  acetaminophen (TYLENOL) tablet 650 mg (650 mg Oral Given 11/20/22 0829)    Or  acetaminophen (TYLENOL) suppository 650 mg ( Rectal See Alternative 11/20/22 0829)  ondansetron (ZOFRAN) tablet 4 mg (has no administration in time range)    Or  ondansetron (ZOFRAN) injection 4 mg (has no administration  in time range)  fentaNYL (SUBLIMAZE) injection 12.5-50 mcg (50 mcg Intravenous Given 11/20/22 1024)  midodrine (PROAMATINE) tablet 5 mg (5 mg Oral Given 11/20/22 0828)  ceFEPIme (MAXIPIME) 2 g in sodium chloride 0.9 % 100 mL IVPB (0 g Intravenous Stopped 11/20/22 1030)  vancomycin (VANCOCIN) IVPB 1000 mg/200 mL premix (has no administration in time range)  lactated ringers bolus 1,000 mL (0 mLs Intravenous Stopped 11/19/22 2250)  vancomycin (VANCOCIN) IVPB 1000 mg/200 mL premix (0 mg Intravenous Stopped 11/19/22 2224)  ceFEPIme (MAXIPIME) 2 g in sodium chloride 0.9 % 100 mL IVPB (0 g Intravenous Stopped 11/19/22 2122)  ondansetron (ZOFRAN) injection 4 mg (4 mg Intravenous Given 11/19/22 2119)   HYDROcodone-acetaminophen (NORCO/VICODIN) 5-325 MG per tablet 1 tablet (1 tablet Oral Given 11/19/22 2300)  diphenhydrAMINE (BENADRYL) capsule 25 mg (25 mg Oral Given 11/19/22 2300)  magnesium sulfate IVPB 1 g 100 mL (0 g Intravenous Stopped 11/20/22 0232)  lactated ringers bolus 1,000 mL (0 mLs Intravenous Stopped 11/20/22 1031)  gadobutrol (GADAVIST) 1 MMOL/ML injection 6.5 mL (6.5 mLs Intravenous Contrast Given 11/20/22 1140)    Mobility walks with device     Focused Assessments Cardiac Assessment Handoff:  Cardiac Rhythm: Sinus bradycardia Lab Results  Component Value Date   CKTOTAL 155 11/19/2022   No results found for: "DDIMER" Does the Patient currently have chest pain? No    R Recommendations: See Admitting Provider Note  Report given to:   Additional Notes: .

## 2022-11-20 NOTE — ED Notes (Signed)
Pt placed on 2L O2 

## 2022-11-20 NOTE — ED Notes (Signed)
Patient transported to Ultrasound 

## 2022-11-20 NOTE — ED Notes (Signed)
Manual BP 84/64

## 2022-11-20 NOTE — Progress Notes (Signed)
Mobility Specialist Progress Note   11/20/22 1726  Mobility  Activity Transferred from chair to bed  Level of Assistance +2 (takes two people)  Press photographer wheel walker  Distance Ambulated (ft) 2 ft  Activity Response Tolerated well  Mobility Referral Yes  $Mobility charge 1 Mobility  Mobility Specialist Start Time (ACUTE ONLY) 1654  Mobility Specialist Stop Time (ACUTE ONLY) 1722  Mobility Specialist Time Calculation (min) (ACUTE ONLY) 28 min   Post Mobility: 69 HR, 102/63 BP, 95% SpO2 on 2LO2  Received in slouched down in chair c/o pain in their lower back, LE's and requesting to get back to bed. +2A minA to stand, pivot and place pt supine in bed d/t generalized pain pt expressed to be 10/10. Transferred w/o fault, call bell in reach and bed alarm on.   Frederico Hamman Mobility Specialist Please contact via SecureChat or  Rehab office at [phone]

## 2022-11-20 NOTE — Progress Notes (Addendum)
PROGRESS NOTE  Catherine Wallace  ZOX:[id number] DOB: 10/16/1950 DOA: 11/19/2022 PCP: Gordan Payment., MD   Brief Narrative: Patient is a 72 year old female with history of COPD, chronic bilateral venous insufficiency of lower extremities, chronic back pain, hypothyroidism who presented with left lower extremity swelling, redness, pain which has been going on for last 2 weeks, not responding to outpatient antibiotic therapy.  There was weeping and drainage with edema from the left leg.  Also developed some diffuse rash for last 2 days.  On presentation she was hypotensive, lactate was 3.1.  Patient was admitted for the management of severe sepsis secondary to left lower extremity cellulitis.  Since systolic blood pressure remained in the range of 80s despite being given IV fluid, PCCM consulted today.  Assessment & Plan:  Principal Problem:   Cellulitis Active Problems:   Acute renal failure (ARF) (HCC)   Rash   Dehydration   Sepsis (HCC)   Hypothyroidism   Tobacco abuse   COPD (chronic obstructive pulmonary disease) (HCC)   Severe sepsis secondary to left lower extremity cellulitis/Septic shock: Presented with edema, erythema, drainage from left lower extremity.  Has history of chronic bilateral lower extremity venous stasis.  Did not respond to outpatient antibiotic therapy.  Initial lab work showed AKI, lactic acid of 3.1 but no leukocytosis.  Started on broad-spectrum antibiotic here with vancomycin and cefepime.  Blood pressure remains low despite being given IV fluid.  Started on midodrine.  PCCM consulted.  She might need pressure support, follow-up cultures.  She is afebrile this morning.  Left lower extremity cellulitis: Has significant erythema, tenderness, weeping, drainage, edema.  X-ray did not show any fracture, dislocation, showed edema.  Will get MRI of the left lower extremity.  Continue current abx for now.  Also will get venous Doppler to rule out DVT. She was taking keflex as  outpatient. We will also consult orthopedics Dr Lajoyce Corners  AKI: Resolved with IV fluid.  Hypothyroidism: Continue Synthyroid  Tobacco use: Counseled for cessation.    Skin rash: Has generalized skin rash:Continue to monitor.  History of COPD: Does not use oxygen at home.  Currently requiring 2 L of oxygen per minute.  Denies shortness of breath or cough.  Continue bronchodilators as needed  Addendum: MRI showed features of cellulitis but no underlying abscess or fluid collection, no evidence of osteomyelitis .  Dr. Lajoyce Corners has been consulted and he will see the patient tomorrow.  Will keep her n.p.o. after midnight just in case.  Continue broad-spectrum antibiotics.  Continue IV fluids.       DVT prophylaxis:SCDs Start: 11/20/22 8119     Code Status: DNR  Family Communication: None at bedside  Patient status:Inpatient  Patient is from :home  Anticipated discharge JY:NWGN  Estimated DC date:2-3 days   Consultants: pCCm  Procedures:None  Antimicrobials:  Anti-infectives (From admission, onward)    Start     Dose/Rate Route Frequency Ordered Stop   11/20/22 2100  ceFEPIme (MAXIPIME) 2 g in sodium chloride 0.9 % 100 mL IVPB        2 g 200 mL/hr over 30 Minutes Intravenous Every 24 hours 11/19/22 2256 11/26/22 2059   11/19/22 2256  vancomycin variable dose per unstable renal function (pharmacist dosing)         Does not apply See admin instructions 11/19/22 2256     11/19/22 2030  vancomycin (VANCOCIN) IVPB 1000 mg/200 mL premix        1,000 mg 200 mL/hr over 60 Minutes Intravenous  Once 11/19/22 2026 11/19/22 2224   11/19/22 2030  ceFEPIme (MAXIPIME) 2 g in sodium chloride 0.9 % 100 mL IVPB        2 g 200 mL/hr over 30 Minutes Intravenous  Once 11/19/22 2026 11/19/22 2122       Subjective: Patient seen and examined at bedside today.  Her blood pressure has been low in the range of 80s.  She is quite asymptomatic.  No fever.  Complains of back pain.  Patient says she  lives alone ambulates with a walker.  Objective: Vitals:   11/20/22 0700 11/20/22 0715 11/20/22 0730 11/20/22 0745  BP: (!) 82/42 (!) 87/48 (!) 95/53 (!) 87/52  Pulse: 76 77 91 81  Resp: 18 14 (!) 24 (!) 25  Temp:      TempSrc:      SpO2: 93% 95% 95% 95%  Weight:      Height:        Intake/Output Summary (Last 24 hours) at 11/20/2022 0902 Last data filed at 11/20/2022 0640 Gross per 24 hour  Intake 2298 ml  Output --  Net 2298 ml   Filed Weights   11/19/22 2046  Weight: 67.1 kg    Examination:  General exam: Overall comfortable, not in distress HEENT: PERRL Respiratory system:  no wheezes or crackles  Cardiovascular system: S1 & S2 heard, RRR.  Gastrointestinal system: Abdomen is nondistended, soft and nontender. Central nervous system: Alert and oriented Extremities: Severe edema, erythema of left leg. Skin: Erythematous left leg, weeping, generalized papular rash   Data Reviewed: I have personally reviewed following labs and imaging studies  CBC: Recent Labs  Lab 11/19/22 1951 11/19/22 2325 11/20/22 0800  WBC 9.7  --  7.7  NEUTROABS 8.3*  --   --   HGB 14.4 13.3 12.8  HCT 43.2 39.0 37.5  MCV 92.7  --  94.0  PLT 212  --  175   Basic Metabolic Panel: Recent Labs  Lab 11/19/22 1847 11/19/22 2315 11/19/22 2325  NA 133*  --  131*  K 4.4  --  4.1  CL 98  --   --   CO2 19*  --   --   GLUCOSE 161*  --   --   BUN 26*  --   --   CREATININE 1.44*  --   --   CALCIUM 9.4  --   --   MG  --  1.6*  --   PHOS  --  2.4*  --      Recent Results (from the past 240 hour(s))  Blood Culture (routine x 2)     Status: None (Preliminary result)   Collection Time: 11/19/22  8:25 PM   Specimen: BLOOD  Result Value Ref Range Status   Specimen Description BLOOD RIGHT ANTECUBITAL  Final   Special Requests   Final    BOTTLES DRAWN AEROBIC AND ANAEROBIC Blood Culture results may not be optimal due to an excessive volume of blood received in culture bottles   Culture    Final    NO GROWTH < 12 HOURS Performed at [facility], [address] [place], [place] [address]    Report Status PENDING  Incomplete  Blood Culture (routine x 2)     Status: None (Preliminary result)   Collection Time: 11/19/22  8:35 PM   Specimen: BLOOD  Result Value Ref Range Status   Specimen Description BLOOD LEFT ANTECUBITAL  Final   Special Requests   Final  BOTTLES DRAWN AEROBIC AND ANAEROBIC Blood Culture results may not be optimal due to an inadequate volume of blood received in culture bottles   Culture   Final    NO GROWTH < 12 HOURS Performed at [facility], [address], [place], [place] [address]    Report Status PENDING  Incomplete  MRSA Next Gen by PCR, Nasal     Status: None   Collection Time: 11/20/22 12:45 AM   Specimen: Nasal Mucosa; Nasal Swab  Result Value Ref Range Status   MRSA by PCR Next Gen NOT DETECTED NOT DETECTED Final    Comment: (NOTE) The GeneXpert MRSA Assay (FDA approved for NASAL specimens only), is one component of a comprehensive MRSA colonization surveillance program. It is not intended to diagnose MRSA infection nor to guide or monitor treatment for MRSA infections. Test performance is not FDA approved in patients less than 1 years old. Performed at [facility], [address], [place], [place] [address]      Radiology Studies: DG Tibia/Fibula Left  Result Date: 11/19/2022 CLINICAL DATA:  Questionable sepsis - evaluate for abnormality EXAM: LEFT TIBIA AND FIBULA - 2 VIEW COMPARISON:  January 28, 2019 FINDINGS: No acute fracture or dislocation. Joint spaces and alignment are maintained. No area of erosion or osseous destruction. No unexpected radiopaque foreign body. Soft tissue edema. IMPRESSION: 1. No acute fracture or dislocation. 2. Soft tissue edema. Electronically Signed   By: Meda Klinefelter M.D.   On: 11/19/2022 21:29   DG Chest Port 1 View  Result Date: 11/19/2022 CLINICAL DATA:  Questionable  sepsis - evaluate for abnormality EXAM: PORTABLE CHEST 1 VIEW COMPARISON:  February 17, 2019 FINDINGS: The cardiomediastinal silhouette is unchanged in contour.Atherosclerotic calcifications of the tortuous thoracic aorta. No pleural effusion. No pneumothorax. No acute pleuroparenchymal abnormality. IMPRESSION: No acute cardiopulmonary abnormality. Electronically Signed   By: Meda Klinefelter M.D.   On: 11/19/2022 21:29    Scheduled Meds:  levothyroxine  25 mcg Oral QAC breakfast   midodrine  5 mg Oral TID WC   vancomycin variable dose per unstable renal function (pharmacist dosing)   Does not apply See admin instructions   Continuous Infusions:  ceFEPime (MAXIPIME) IV     lactated ringers 150 mL/hr at 11/20/22 0641     LOS: 1 day   Burnadette Pop, MD [facility] P6/25/2024, 9:02 AM

## 2022-11-20 NOTE — ED Notes (Signed)
Patient transported to MRI 

## 2022-11-20 NOTE — Progress Notes (Signed)
BLE venous duplex has been completed.   Results can be found under chart review under CV PROC. 11/20/2022 10:32 AM Bryndan Bilyk RVT, RDMS

## 2022-11-21 DIAGNOSIS — I890 Lymphedema, not elsewhere classified: Secondary | ICD-10-CM | POA: Diagnosis not present

## 2022-11-21 DIAGNOSIS — L03116 Cellulitis of left lower limb: Secondary | ICD-10-CM | POA: Diagnosis not present

## 2022-11-21 DIAGNOSIS — I872 Venous insufficiency (chronic) (peripheral): Secondary | ICD-10-CM | POA: Diagnosis not present

## 2022-11-21 LAB — BASIC METABOLIC PANEL
Anion gap: 13 (ref 5–15)
BUN: 14 mg/dL (ref 8–23)
CO2: 23 mmol/L (ref 22–32)
Calcium: 8.2 mg/dL — ABNORMAL LOW (ref 8.9–10.3)
Chloride: 97 mmol/L — ABNORMAL LOW (ref 98–111)
Creatinine, Ser: 0.88 mg/dL (ref 0.44–1.00)
GFR, Estimated: >60 mL/min (ref >60)
Glucose, Bld: 87 mg/dL (ref 70–99)
Potassium: 4.1 mmol/L (ref 3.5–5.1)
Sodium: 133 mmol/L — ABNORMAL LOW (ref 135–145)

## 2022-11-21 LAB — CBC
HCT: 37.5 % (ref 36.0–46.0)
Hemoglobin: 12.6 g/dL (ref 12.0–15.0)
MCH: 31.9 pg (ref 26.0–34.0)
MCHC: 33.6 g/dL (ref 30.0–36.0)
MCV: 94.9 fL (ref 80.0–100.0)
Platelets: 175 10*3/uL (ref 150–400)
RBC: 3.95 MIL/uL (ref 3.87–5.11)
RDW: 12.7 % (ref 11.5–15.5)
WBC: 7 10*3/uL (ref 4.0–10.5)
nRBC: 0 % (ref 0.0–0.2)

## 2022-11-21 LAB — CULTURE, BLOOD (ROUTINE X 2): Culture: NO GROWTH

## 2022-11-21 MED ORDER — HYDROXYZINE HCL 10 MG PO TABS
10.0000 mg | ORAL_TABLET | Freq: Three times a day (TID) | ORAL | Status: DC | PRN
  Administered 2022-11-23 – 2022-11-24 (×2): 10 mg via ORAL
  Filled 2022-11-21 (×2): qty 1

## 2022-11-21 MED ORDER — ENSURE ENLIVE PO LIQD
237.0000 mL | Freq: Two times a day (BID) | ORAL | Status: DC
  Administered 2022-11-21 – 2022-11-24 (×8): 237 mL via ORAL

## 2022-11-21 MED ORDER — SODIUM CHLORIDE 0.9 % IV SOLN
2.0000 g | INTRAVENOUS | Status: DC
  Administered 2022-11-21 – 2022-11-22 (×2): 2 g via INTRAVENOUS
  Filled 2022-11-21 (×2): qty 20

## 2022-11-21 MED ORDER — ADULT MULTIVITAMIN W/MINERALS CH
1.0000 | ORAL_TABLET | Freq: Every day | ORAL | Status: DC
  Administered 2022-11-21 – 2022-11-24 (×4): 1 via ORAL
  Filled 2022-11-21 (×4): qty 1

## 2022-11-21 NOTE — Progress Notes (Signed)
Orthopedic Tech Progress Note Patient Details:  Catherine Wallace [birth year] [id number]  Called In order to [employer] for 3 pair of COMPRESSION SOCKS (15-64mmHg) 2XL   Patient ID: Catherine Wallace, female   DOB: May 04, 1951, 72 y.o.   MRN: [id number]  Catherine Wallace 11/21/2022, 8:36 AM

## 2022-11-21 NOTE — TOC Progression Note (Signed)
Transition of Care [facility]) - Progression Note    Patient Details  Name: Catherine Wallace MRN: [id number] Date of Birth: [birth year]  [facility] CM/SW Contact  Mearl Latin, LCSW Phone Number: 11/21/2022, 3:08 PM  Clinical Narrative:    CSW met with patient regarding SNF recommendation. Patient stated that she does not want to go to SNF and would like to return home with home health services. She stated she is familiar with home health and knows that they only come out twice a week but she feels safe returning home. She stated she has one son in [place] and one in [place] who can assist her. TOC to follow for home health set up.     Expected Discharge Plan: Home w Home Health Services Barriers to Discharge: Continued Medical Work up  Expected Discharge Plan and Services In-house Referral: Clinical Social Work Discharge Planning Services: NA Post Acute Care Choice: NA Living arrangements for the past 2 months: Single Family Home                 DME Arranged: N/A (Recs TBD) DME Agency: NA (Recs TBD)       HH Arranged:  (RECS TBD) HH Agency:  (RECS TBD)         Social Determinants of Health (SDOH) Interventions SDOH Screenings   Tobacco Use: High Risk (11/20/2022)    Readmission Risk Interventions     No data to display

## 2022-11-21 NOTE — TOC Initial Note (Addendum)
Transition of Care [facility]) - Initial/Assessment Note    Patient Details  Name: Catherine Wallace MRN: [id number] Date of Birth: 1951/03/03  [facility]) CM/SW Contact:    Gordy Clement, RN Phone Number: 11/21/2022, 11:53 AM  Clinical Narrative:      CM met with Patient bedside to complete initial assessment. Patient lives home alone but does have 2 Sons that check on her regularly. One lives in [place] and the other in [place].  She also has neighbors that occasionally help with taking out trash or carrying in groceries.  She has a rolling walker, a rollator and a wheelchair.  Patient is insured and does have an established PCP.     The current recommendation from PT and OT is for patient to dc to a SNF. If patient dc's to home, Son will transport TOC will continue to follow patient for any additional discharge needs               Expected Discharge Plan: Home w Home Health Services Barriers to Discharge: Continued Medical Work up   Patient Goals and CMS Choice Patient states their goals for this hospitalization and ongoing recovery are:: Go home at DC [facility] Medicare.gov Compare Post Acute Care list provided to:: Patient Choice offered to / list presented to : Patient      Expected Discharge Plan and Services   Discharge Planning Services: NA Post Acute Care Choice: NA Living arrangements for the past 2 months: Single Family Home                 DME Arranged: N/A (Recs TBD) DME Agency: NA (Recs TBD)       HH Arranged:  (RECS TBD) HH Agency:  (RECS TBD)        Prior Living Arrangements/Services Living arrangements for the past 2 months: Single Family Home Lives with:: Self Patient language and need for interpreter reviewed:: Yes Do you feel safe going back to the place where you live?: Yes      Need for Family Participation in Patient Care: No (Comment) Care giver support system in place?: Yes (comment) (Sons live close by and visit often) Current home  services:  (NONE) Criminal Activity/Legal Involvement Pertinent to Current Situation/Hospitalization: No - Comment as needed  Activities of Daily Living      Permission Sought/Granted                  Emotional Assessment Appearance:: Appears stated age Attitude/Demeanor/Rapport: Engaged Affect (typically observed): Pleasant Orientation: : Oriented to Self, Oriented to Place, Oriented to  Time, Oriented to Situation Alcohol / Substance Use: Not Applicable Psych Involvement: No (comment)  Admission diagnosis:  Cellulitis [L03.90] Cellulitis of left lower extremity [L03.116] Sepsis, due to unspecified organism, unspecified whether acute organ dysfunction present [facility]) [A41.9] Patient Active Problem List   Diagnosis Date Noted   Venous insufficiency (chronic) (peripheral) 11/21/2022   Lymphedema 11/21/2022   Cellulitis 11/19/2022   Dehydration 11/19/2022   Sepsis (HCC) 11/19/2022   Hypothyroidism 11/19/2022   Tobacco abuse 11/19/2022   COPD (chronic obstructive pulmonary disease) (HCC) 11/19/2022   Acute renal failure (ARF) (HCC) 03/04/2019   AKI (acute kidney injury) (HCC)    Fall    Localized skin desquamation    Rash    PCP:  Gordan Payment., MD Pharmacy:   [id number] - [place], [place] - [address] [place] [address] Phone: [phone] Fax: [phone]     Social Determinants  of Health (SDOH) Social History: SDOH Screenings   Tobacco Use: High Risk (11/20/2022)   SDOH Interventions:     Readmission Risk Interventions     No data to display

## 2022-11-21 NOTE — Progress Notes (Signed)
Compression sock placed on right lower leg patient could not tolerate compression sock on the left to tender. Saline gauzed placed with kerlex to left lower extremity.

## 2022-11-21 NOTE — Progress Notes (Signed)
Progress Note    Sherlynn Tourville  WUJ:[id number] DOB: 1950-09-20  DOA: 11/19/2022 PCP: Gordan Payment., MD      Brief Narrative:    Medical records reviewed and are as summarized below:  Adaliah Hiegel is a 72 y.o. female with history of COPD, chronic bilateral venous insufficiency of lower extremities, chronic back pain, hypothyroidism who presented with left lower extremity swelling, redness, pain which has been going on for last 2 weeks, not responding to outpatient antibiotic therapy. There was weeping and drainage with edema from the left leg.  She also complained of rash that developed about 2 days prior to admission.         Assessment/Plan:   Principal Problem:   Cellulitis Active Problems:   Acute renal failure (ARF) (HCC)   Rash   Dehydration   Sepsis (HCC)   Hypothyroidism   Tobacco abuse   COPD (chronic obstructive pulmonary disease) (HCC)   Venous insufficiency (chronic) (peripheral)   Lymphedema   Nutrition Problem: Increased nutrient needs Etiology: acute illness  Signs/Symptoms: estimated needs   Body mass index is 32.03 kg/m.  (Obesity)   Septic shock, severe sepsis secondary to left lower extremity cellulitis, chronic venous insufficiency: BP has improved.  Discontinue IV fluids to avoid fluid overload.  Continue midodrine. Continue IV vancomycin.  Change IV cefepime to IV ceftriaxone.  Analgesics as needed for pain.  Appreciate input from orthopedic surgeon. She had failed outpatient antibiotics. Venous duplex negative for DVT and MRI of the leg did not show any evidence of abscess.   AKI: Creatinine has improved.  Discontinue IV fluids.   Hypomagnesemia: Improved   Diffuse erythematous maculopapular rash on the trunk, upper and lower extremities: Atarax as needed.   Other comorbidities include COPD, hypothyroidism    Diet Order             Diet regular Room service appropriate? Yes with Assist; Fluid consistency: Thin  Diet  effective now                            Consultants: Orthopedic surgeon Intensivist  Procedures: None    Medications:    feeding supplement  237 mL Oral BID BM   levothyroxine  25 mcg Oral QAC breakfast   midodrine  10 mg Oral TID WC   multivitamin with minerals  1 tablet Oral Daily   Continuous Infusions:  cefTRIAXone (ROCEPHIN)  IV     vancomycin       Anti-infectives (From admission, onward)    Start     Dose/Rate Route Frequency Ordered Stop   11/21/22 2100  vancomycin (VANCOCIN) IVPB 1000 mg/200 mL premix        1,000 mg 200 mL/hr over 60 Minutes Intravenous Every 48 hours 11/20/22 0948     11/21/22 1800  cefTRIAXone (ROCEPHIN) 2 g in sodium chloride 0.9 % 100 mL IVPB        2 g 200 mL/hr over 30 Minutes Intravenous Every 24 hours 11/21/22 1132     11/20/22 2100  ceFEPIme (MAXIPIME) 2 g in sodium chloride 0.9 % 100 mL IVPB  Status:  Discontinued        2 g 200 mL/hr over 30 Minutes Intravenous Every 24 hours 11/19/22 2256 11/20/22 0948   11/20/22 1000  ceFEPIme (MAXIPIME) 2 g in sodium chloride 0.9 % 100 mL IVPB  Status:  Discontinued        2 g 200 mL/hr  over 30 Minutes Intravenous Every 12 hours 11/20/22 0948 11/21/22 1132   11/19/22 2256  vancomycin variable dose per unstable renal function (pharmacist dosing)  Status:  Discontinued         Does not apply See admin instructions 11/19/22 2256 11/20/22 0948   11/19/22 2030  vancomycin (VANCOCIN) IVPB 1000 mg/200 mL premix        1,000 mg 200 mL/hr over 60 Minutes Intravenous  Once 11/19/22 2026 11/19/22 2224   11/19/22 2030  ceFEPIme (MAXIPIME) 2 g in sodium chloride 0.9 % 100 mL IVPB        2 g 200 mL/hr over 30 Minutes Intravenous  Once 11/19/22 2026 11/19/22 2122              Family Communication/Anticipated D/C date and plan/Code Status   DVT prophylaxis: SCDs Start: 11/20/22 9323     Code Status: DNR  Family Communication: None Disposition Plan: Plan to discharge to  SNF   Status is: Inpatient Remains inpatient appropriate because: On IV antibiotics for cellulitis       Subjective:   Interval events noted.  She complains of pain in the left leg.  She also has a rash on her body.  She said the rash started about 2 days prior to admission.  It is a little itchy and uncomfortable.  No cough, shortness of breath, wheezing, chest pain or abdominal pain.  Objective:    Vitals:   11/21/22 0411 11/21/22 0800 11/21/22 1234 11/21/22 1551  BP: 115/61 115/60 (!) 96/50 107/60  Pulse:  91  93  Resp: 18 18 16 20   Temp: 97.7 F (36.5 C) 99.1 F (37.3 C) 97.7 F (36.5 C) 97.8 F (36.6 C)  TempSrc: Oral Oral Oral Oral  SpO2:  98%  96%  Weight:      Height:       No data found.   Intake/Output Summary (Last 24 hours) at 11/21/2022 1717 Last data filed at 11/21/2022 1300 Gross per 24 hour  Intake 740 ml  Output --  Net 740 ml   Filed Weights   11/19/22 2046  Weight: 67.1 kg    Exam:  GEN: NAD SKIN: Diffuse maculopapular erythematous rash on the trunk, upper and lower extremities EYES: No pallor or icterus ENT: MMM CV: RRR PULM: CTA B ABD: soft, obese, NT, +BS CNS: AAO x 3, non focal EXT: Chronic erythematous changes of right lower leg leg.  Erythema, swelling and tenderness of the left leg and left foot.            Data Reviewed:   I have personally reviewed following labs and imaging studies:  Labs: Labs show the following:   Basic Metabolic Panel: Recent Labs  Lab 11/19/22 1847 11/19/22 2315 11/19/22 2325 11/20/22 0800 11/21/22 0319  NA 133*  --  131* 133* 133*  K 4.4  --  4.1 4.1 4.1  CL 98  --   --  99 97*  CO2 19*  --   --  25 23  GLUCOSE 161*  --   --  94 87  BUN 26*  --   --  17 14  CREATININE 1.44*  --   --  1.00 0.88  CALCIUM 9.4  --   --  8.1* 8.2*  MG  --  1.6*  --  1.8  --   PHOS  --  2.4*  --  2.9  --    GFR Estimated Creatinine Clearance: 44.9 mL/min (by C-G formula based  on SCr of 0.88  mg/dL). Liver Function Tests: Recent Labs  Lab 11/19/22 1847 11/20/22 0800  AST 30 45*  ALT 14 18  ALKPHOS 107 78  BILITOT 0.5 0.5  PROT 6.6 5.1*  ALBUMIN 3.3* 2.6*   No results for input(s): "LIPASE", "AMYLASE" in the last 168 hours. No results for input(s): "AMMONIA" in the last 168 hours. Coagulation profile Recent Labs  Lab 11/19/22 2315  INR 1.1    CBC: Recent Labs  Lab 11/19/22 1951 11/19/22 2325 11/20/22 0800 11/21/22 0319  WBC 9.7  --  7.7 7.0  NEUTROABS 8.3*  --   --   --   HGB 14.4 13.3 12.8 12.6  HCT 43.2 39.0 37.5 37.5  MCV 92.7  --  94.0 94.9  PLT 212  --  175 175   Cardiac Enzymes: Recent Labs  Lab 11/19/22 2315  CKTOTAL 155   BNP (last 3 results) No results for input(s): "PROBNP" in the last 8760 hours. CBG: No results for input(s): "GLUCAP" in the last 168 hours. D-Dimer: No results for input(s): "DDIMER" in the last 72 hours. Hgb A1c: No results for input(s): "HGBA1C" in the last 72 hours. Lipid Profile: No results for input(s): "CHOL", "HDL", "LDLCALC", "TRIG", "CHOLHDL", "LDLDIRECT" in the last 72 hours. Thyroid function studies: Recent Labs    11/19/22 2315  TSH 1.049   Anemia work up: No results for input(s): "VITAMINB12", "FOLATE", "FERRITIN", "TIBC", "IRON", "RETICCTPCT" in the last 72 hours. Sepsis Labs: Recent Labs  Lab 11/19/22 1951 11/19/22 2217 11/19/22 2315 11/20/22 0124 11/20/22 0800 11/21/22 0319  PROCALCITON  --   --  0.16  --   --   --   WBC 9.7  --   --   --  7.7 7.0  LATICACIDVEN 3.1* 2.5*  --  1.1  --   --     Microbiology Recent Results (from the past 240 hour(s))  Blood Culture (routine x 2)     Status: None (Preliminary result)   Collection Time: 11/19/22  8:25 PM   Specimen: BLOOD  Result Value Ref Range Status   Specimen Description BLOOD RIGHT ANTECUBITAL  Final   Special Requests   Final    BOTTLES DRAWN AEROBIC AND ANAEROBIC Blood Culture results may not be optimal due to an excessive volume  of blood received in culture bottles   Culture   Final    NO GROWTH 2 DAYS Performed at [facility], [address] [place], [place] [address]    Report Status PENDING  Incomplete  Blood Culture (routine x 2)     Status: None (Preliminary result)   Collection Time: 11/19/22  8:35 PM   Specimen: BLOOD  Result Value Ref Range Status   Specimen Description BLOOD LEFT ANTECUBITAL  Final   Special Requests   Final    BOTTLES DRAWN AEROBIC AND ANAEROBIC Blood Culture results may not be optimal due to an inadequate volume of blood received in culture bottles   Culture   Final    NO GROWTH 2 DAYS Performed at [facility], [address], [place], [place] [address]    Report Status PENDING  Incomplete  MRSA Next Gen by PCR, Nasal     Status: None   Collection Time: 11/20/22 12:45 AM   Specimen: Nasal Mucosa; Nasal Swab  Result Value Ref Range Status   MRSA by PCR Next Gen NOT DETECTED NOT DETECTED Final    Comment: (NOTE) The GeneXpert MRSA Assay (FDA approved  for NASAL specimens only), is one component of a comprehensive MRSA colonization surveillance program. It is not intended to diagnose MRSA infection nor to guide or monitor treatment for MRSA infections. Test performance is not FDA approved in patients less than 18 years old. Performed at [facility], [address], [place], [place] [address]     Procedures and diagnostic studies:  VAS [place] LOWER EXTREMITY VENOUS (DVT) (ONLY [facility] & [facility])  Result Date: 11/20/2022  Lower Venous DVT Study Patient Name:  ITZEL MCKIBBIN  Date of Exam:   11/20/2022 Medical Rec #: [id number]   Accession #:    [id number] Date of Birth: 1950-09-05    Patient Gender: F Patient Age:   63 years Exam Location:  [facility] Procedure:      VAS [place] LOWER EXTREMITY VENOUS (DVT) Referring Phys: Jonny Ruiz DOUTOVA --------------------------------------------------------------------------------  Indications: Pain, Erythema, Warmth and Edema.  (Cellulitis)  Limitations: Body habitus and patient movement. Comparison Study: Previous exams performed at outside facilities - unable to                   view reports. Performing Technologist: Ernestene Mention RVT, RDMS  Examination Guidelines: A complete evaluation includes B-mode imaging, spectral Doppler, color Doppler, and power Doppler as needed of all accessible portions of each vessel. Bilateral testing is considered an integral part of a complete examination. Limited examinations for reoccurring indications may be performed as noted. The reflux portion of the exam is performed with the patient in reverse Trendelenburg.  +---------+---------------+---------+-----------+----------+--------------+ RIGHT    CompressibilityPhasicitySpontaneityPropertiesThrombus Aging +---------+---------------+---------+-----------+----------+--------------+ CFV      Full           Yes      Yes                                 +---------+---------------+---------+-----------+----------+--------------+ SFJ      Full                                                        +---------+---------------+---------+-----------+----------+--------------+ FV Prox  Full           Yes      Yes                                 +---------+---------------+---------+-----------+----------+--------------+ FV Mid   Full           Yes      Yes                                 +---------+---------------+---------+-----------+----------+--------------+ FV DistalFull           Yes      Yes                                 +---------+---------------+---------+-----------+----------+--------------+ PFV      Full                                                        +---------+---------------+---------+-----------+----------+--------------+  POP      Full           Yes      Yes                                 +---------+---------------+---------+-----------+----------+--------------+ PTV      Full                                                         +---------+---------------+---------+-----------+----------+--------------+ PERO     Full                                                        +---------+---------------+---------+-----------+----------+--------------+   +---------+---------------+---------+-----------+----------+-------------------+ LEFT     CompressibilityPhasicitySpontaneityPropertiesThrombus Aging      +---------+---------------+---------+-----------+----------+-------------------+ CFV      Full           Yes      Yes                                      +---------+---------------+---------+-----------+----------+-------------------+ SFJ      Full                                                             +---------+---------------+---------+-----------+----------+-------------------+ FV Prox  Full           Yes      Yes                                      +---------+---------------+---------+-----------+----------+-------------------+ FV Mid   Full           Yes      Yes                                      +---------+---------------+---------+-----------+----------+-------------------+ FV DistalFull           Yes      Yes                                      +---------+---------------+---------+-----------+----------+-------------------+ PFV      Full                                                             +---------+---------------+---------+-----------+----------+-------------------+ POP      Full           Yes      Yes                                      +---------+---------------+---------+-----------+----------+-------------------+  PTV                                                   Not well visualized +---------+---------------+---------+-----------+----------+-------------------+ PERO                                                  Not well visualized  +---------+---------------+---------+-----------+----------+-------------------+     Summary: BILATERAL: - No evidence of deep vein thrombosis seen in the lower extremities, bilaterally. -No evidence of popliteal cyst, bilaterally.   *See table(s) above for measurements and observations. Electronically signed by Lemar Livings MD on 11/20/2022 at 4:08:18 PM.    Final    ECHOCARDIOGRAM COMPLETE  Result Date: 11/20/2022    ECHOCARDIOGRAM REPORT   Patient Name:   ATHENIA Safran Date of Exam: 11/20/2022 Medical Rec #:  [id number]  Height:       57.0 in Accession #:    [id number] Weight:       148.0 lb Date of Birth:  09/13/1950   BSA:          1.583 m Patient Age:    72 years   BP:           88/53 mmHg Patient Gender: F          HR:           69 bpm. Exam Location:  Inpatient Procedure: 2D Echo, Cardiac Doppler and Color Doppler Indications:    Fever  History:        Patient has no prior history of Echocardiogram examinations.                 COPD.  Sonographer:    Darlys Gales Referring Phys: [id number] ANASTASSIA DOUTOVA IMPRESSIONS  1. Left ventricular ejection fraction, by estimation, is 60 to 65%. The left ventricle has normal function. The left ventricle has no regional wall motion abnormalities. Left ventricular diastolic parameters are indeterminate.  2. Right ventricular systolic function is normal. The right ventricular size is normal.  3. The mitral valve is normal in structure. Trivial mitral valve regurgitation. No evidence of mitral stenosis.  4. The aortic valve is normal in structure. Aortic valve regurgitation is not visualized. No aortic stenosis is present.  5. The inferior vena cava is normal in size with greater than 50% respiratory variability, suggesting right atrial pressure of 3 mmHg. FINDINGS  Left Ventricle: Left ventricular ejection fraction, by estimation, is 60 to 65%. The left ventricle has normal function. The left ventricle has no regional wall motion abnormalities. The left ventricular internal  cavity size was normal in size. There is  no left ventricular hypertrophy. Left ventricular diastolic parameters are indeterminate. Right Ventricle: The right ventricular size is normal. No increase in right ventricular wall thickness. Right ventricular systolic function is normal. Left Atrium: Left atrial size was normal in size. Right Atrium: Right atrial size was normal in size. Pericardium: There is no evidence of pericardial effusion. Mitral Valve: The mitral valve is normal in structure. Trivial mitral valve regurgitation. No evidence of mitral valve stenosis. Tricuspid Valve: The tricuspid valve is normal in structure. Tricuspid valve regurgitation is not demonstrated. No evidence of tricuspid stenosis. Aortic Valve: The aortic valve is normal in  structure. Aortic valve regurgitation is not visualized. No aortic stenosis is present. Aortic valve mean gradient measures 4.0 mmHg. Aortic valve peak gradient measures 7.0 mmHg. Pulmonic Valve: The pulmonic valve was normal in structure. Pulmonic valve regurgitation is not visualized. No evidence of pulmonic stenosis. Aorta: The aortic root is normal in size and structure. Venous: The inferior vena cava is normal in size with greater than 50% respiratory variability, suggesting right atrial pressure of 3 mmHg. IAS/Shunts: No atrial level shunt detected by color flow Doppler.  LEFT VENTRICLE PLAX 2D LVIDd:         3.70 cm Diastology LVIDs:         2.40 cm LV e' medial:    8.05 cm/s LV PW:         1.20 cm LV E/e' medial:  14.3 LV IVS:        1.00 cm LV e' lateral:   10.80 cm/s                        LV E/e' lateral: 10.6  RIGHT VENTRICLE             IVC RV S prime:     14.10 cm/s  IVC diam: 2.10 cm TAPSE (M-mode): 2.9 cm LEFT ATRIUM             Index        RIGHT ATRIUM           Index LA Vol (A2C):   31.3 ml 19.78 ml/m  RA Area:     13.20 cm LA Vol (A4C):   23.7 ml 14.98 ml/m  RA Volume:   32.90 ml  20.79 ml/m LA Biplane Vol: 29.7 ml 18.77 ml/m  AORTIC VALVE AV  Vmax:           132.00 cm/s AV Vmean:          88.200 cm/s AV VTI:            0.288 m AV Peak Grad:      7.0 mmHg AV Mean Grad:      4.0 mmHg LVOT Vmax:         139.00 cm/s LVOT Vmean:        85.200 cm/s LVOT VTI:          0.296 m LVOT/AV VTI ratio: 1.03  AORTA Ao Asc diam: 2.80 cm MITRAL VALVE                TRICUSPID VALVE MV Area (PHT): 3.99 cm     TR Peak grad:   22.3 mmHg MV Decel Time: 190 msec     TR Vmax:        236.00 cm/s MV E velocity: 115.00 cm/s MV A velocity: 111.00 cm/s  SHUNTS MV E/A ratio:  1.04         Systemic VTI: 0.30 m Lavona Mound Tobb DO Electronically signed by Thomasene Ripple DO Signature Date/Time: 11/20/2022/2:59:53 PM    Final    MR TIBIA FIBULA LEFT W WO CONTRAST  Result Date: 11/20/2022 CLINICAL DATA:  History of venous insufficiency. Chronic bilateral lower extremity edema. EXAM: MRI OF LOWER LEFT EXTREMITY WITHOUT AND WITH CONTRAST TECHNIQUE: Multiplanar, multisequence MR imaging of the left was performed both before and after administration of intravenous contrast. CONTRAST:  6.32mL GADAVIST GADOBUTROL 1 MMOL/ML IV SOLN COMPARISON:  Radiograph dated November 19, 2022 FINDINGS: Bones/Joint/Cartilage Marrow signal is within normal limits. No fracture or osteonecrosis. Ligaments Intact Muscles and Tendons  Muscles are normal in bulk. No intramuscular hematoma. No abnormal enhancement. Mild edema of the medial and lateral head of the gastrocnemius without evidence of fluid collection or abnormal enhancement. Tendons of the flexor, extensor and peroneal compartments appear intact. Soft tissues There is marked skin thickening and subcutaneous soft tissue edema. No fluid collection or abscess. IMPRESSION: 1. Marked skin thickening and subcutaneous soft tissue edema consistent with cellulitis. No fluid collection or abscess. 2. Mild edema of the medial and lateral head of the gastrocnemius muscles without evidence of fluid collection or abnormal enhancement. 3. No evidence of osteomyelitis.  Electronically Signed   By: Larose Hires D.O.   On: 11/20/2022 12:39   DG Tibia/Fibula Left  Result Date: 11/19/2022 CLINICAL DATA:  Questionable sepsis - evaluate for abnormality EXAM: LEFT TIBIA AND FIBULA - 2 VIEW COMPARISON:  January 28, 2019 FINDINGS: No acute fracture or dislocation. Joint spaces and alignment are maintained. No area of erosion or osseous destruction. No unexpected radiopaque foreign body. Soft tissue edema. IMPRESSION: 1. No acute fracture or dislocation. 2. Soft tissue edema. Electronically Signed   By: Meda Klinefelter M.D.   On: 11/19/2022 21:29   DG Chest Port 1 View  Result Date: 11/19/2022 CLINICAL DATA:  Questionable sepsis - evaluate for abnormality EXAM: PORTABLE CHEST 1 VIEW COMPARISON:  February 17, 2019 FINDINGS: The cardiomediastinal silhouette is unchanged in contour.Atherosclerotic calcifications of the tortuous thoracic aorta. No pleural effusion. No pneumothorax. No acute pleuroparenchymal abnormality. IMPRESSION: No acute cardiopulmonary abnormality. Electronically Signed   By: Meda Klinefelter M.D.   On: 11/19/2022 21:29               LOS: 2 days   Norvel Wenker  [facility]   Pager on [email or web address]. If 7PM-7AM, please contact night-coverage at [email or web address]     11/21/2022, 5:17 PM

## 2022-11-21 NOTE — Consult Note (Signed)
ORTHOPAEDIC CONSULTATION  REQUESTING PHYSICIAN: Lurene Shadow, MD  Chief Complaint: Cellulitis swelling and pain left leg.  HPI: Catherine Wallace is a 72 y.o. 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  History reviewed. No pertinent past medical history. History reviewed. No pertinent surgical history. Social History   Socioeconomic History   Marital status: Widowed    Spouse name: Not on file   Number of children: Not on file   Years of education: Not on file   Highest education level: Not on file  Occupational History   Not on file  Tobacco Use   Smoking status: Every Day   Smokeless tobacco: Never  Substance and Sexual Activity   Alcohol use: Not on file   Drug use: Not on file   Sexual activity: Not on file  Other Topics Concern   Not on file  Social History Narrative   Not on file   Social Determinants of Health   Financial Resource Strain: Not on file  Food Insecurity: Not on file  Transportation Needs: Not on file  Physical Activity: Not on file  Stress: Not on file  Social Connections: Not on file   History reviewed. No pertinent family history. - negative except otherwise stated in the family history section Allergies  Allergen Reactions   Tape Rash   Penicillins Nausea And Vomiting   Amoxicillin Rash   Hydrocodone Rash   Sulfamethoxazole-Trimethoprim Rash   Prior to Admission medications   Medication Sig Start Date End Date Taking? Authorizing Provider  carboxymethylcellulose (REFRESH PLUS) 0.5 % SOLN Place 2 drops into both eyes 3 (three) times daily.    Yes [provider]  cephALEXin (KEFLEX) 250 MG capsule Take 250 mg by mouth 4 (four) times daily.   Yes [provider]  Cholecalciferol (VITAMIN D) 50 MCG (2000 UT) tablet Take 2,000 Units by mouth daily.   Yes [provider]  furosemide (LASIX) 20 MG tablet Take 10 mg by mouth every other day. 04/25/22   Yes [provider]  levothyroxine (SYNTHROID) 25 MCG tablet Take 25 mcg by mouth daily before breakfast.   Yes [provider]  loratadine (CLARITIN) 10 MG tablet Take 10 mg by mouth daily as needed for allergies.   Yes [provider]  Multiple Vitamins-Minerals (ALIVE WOMENS 50+ COMPLETE MV PO) Take 1 tablet by mouth daily.   Yes [provider]  oxybutynin (DITROPAN-XL) 5 MG 24 hr tablet Take 5 mg by mouth daily. 12/11/18  Yes [provider]   VAS [place] LOWER EXTREMITY VENOUS (DVT) (ONLY [facility] & [facility])  Result Date: 11/20/2022  Lower Venous DVT Study Patient Name:  Catherine Wallace  Date of Exam:   11/20/2022 Medical Rec #: [id number]   Accession #:    [id number] Date of Birth: 1950-07-13    Patient Gender: F Patient Age:   8 years Exam Location:  [facility] Procedure:      VAS [place] LOWER EXTREMITY VENOUS (DVT) Referring Phys: Jonny Ruiz DOUTOVA --------------------------------------------------------------------------------  Indications: Pain, Erythema, Warmth and Edema. (Cellulitis)  Limitations: Body habitus and patient movement. Comparison Study: Previous exams performed at outside facilities - unable to                   view reports. Performing Technologist: Ernestene Mention RVT, RDMS  Examination Guidelines: A complete evaluation includes B-mode imaging, spectral Doppler, color Doppler, and power Doppler as needed of all accessible portions of each vessel. Bilateral testing  is considered an integral part of a complete examination. Limited examinations for reoccurring indications may be performed as noted. The reflux portion of the exam is performed with the patient in reverse Trendelenburg.  +---------+---------------+---------+-----------+----------+--------------+ RIGHT    CompressibilityPhasicitySpontaneityPropertiesThrombus Aging +---------+---------------+---------+-----------+----------+--------------+ CFV      Full           Yes      Yes                                  +---------+---------------+---------+-----------+----------+--------------+ SFJ      Full                                                        +---------+---------------+---------+-----------+----------+--------------+ FV Prox  Full           Yes      Yes                                 +---------+---------------+---------+-----------+----------+--------------+ FV Mid   Full           Yes      Yes                                 +---------+---------------+---------+-----------+----------+--------------+ FV DistalFull           Yes      Yes                                 +---------+---------------+---------+-----------+----------+--------------+ PFV      Full                                                        +---------+---------------+---------+-----------+----------+--------------+ POP      Full           Yes      Yes                                 +---------+---------------+---------+-----------+----------+--------------+ PTV      Full                                                        +---------+---------------+---------+-----------+----------+--------------+ PERO     Full                                                        +---------+---------------+---------+-----------+----------+--------------+   +---------+---------------+---------+-----------+----------+-------------------+ LEFT     CompressibilityPhasicitySpontaneityPropertiesThrombus Aging      +---------+---------------+---------+-----------+----------+-------------------+ CFV      Full  Yes      Yes                                      +---------+---------------+---------+-----------+----------+-------------------+ SFJ      Full                                                             +---------+---------------+---------+-----------+----------+-------------------+ FV Prox  Full           Yes      Yes                                       +---------+---------------+---------+-----------+----------+-------------------+ FV Mid   Full           Yes      Yes                                      +---------+---------------+---------+-----------+----------+-------------------+ FV DistalFull           Yes      Yes                                      +---------+---------------+---------+-----------+----------+-------------------+ PFV      Full                                                             +---------+---------------+---------+-----------+----------+-------------------+ POP      Full           Yes      Yes                                      +---------+---------------+---------+-----------+----------+-------------------+ PTV                                                   Not well visualized +---------+---------------+---------+-----------+----------+-------------------+ PERO                                                  Not well visualized +---------+---------------+---------+-----------+----------+-------------------+     Summary: BILATERAL: - No evidence of deep vein thrombosis seen in the lower extremities, bilaterally. -No evidence of popliteal cyst, bilaterally.   *See table(s) above for measurements and observations. Electronically signed by Lemar Livings MD on 11/20/2022 at 4:08:18 PM.    Final    ECHOCARDIOGRAM COMPLETE  Result Date: 11/20/2022    ECHOCARDIOGRAM REPORT   Patient Name:   Catherine Wallace  Date of Exam: 11/20/2022 Medical Rec #:  [id number]  Height:       57.0 in Accession #:    [id number] Weight:       148.0 lb Date of Birth:  10-Dec-1950   BSA:          1.583 m Patient Age:    72 years   BP:           88/53 mmHg Patient Gender: F          HR:           69 bpm. Exam Location:  Inpatient Procedure: 2D Echo, Cardiac Doppler and Color Doppler Indications:    Fever  History:        Patient has no prior history of Echocardiogram examinations.                 COPD.  Sonographer:     Darlys Gales Referring Phys: [id number] ANASTASSIA DOUTOVA IMPRESSIONS  1. Left ventricular ejection fraction, by estimation, is 60 to 65%. The left ventricle has normal function. The left ventricle has no regional wall motion abnormalities. Left ventricular diastolic parameters are indeterminate.  2. Right ventricular systolic function is normal. The right ventricular size is normal.  3. The mitral valve is normal in structure. Trivial mitral valve regurgitation. No evidence of mitral stenosis.  4. The aortic valve is normal in structure. Aortic valve regurgitation is not visualized. No aortic stenosis is present.  5. The inferior vena cava is normal in size with greater than 50% respiratory variability, suggesting right atrial pressure of 3 mmHg. FINDINGS  Left Ventricle: Left ventricular ejection fraction, by estimation, is 60 to 65%. The left ventricle has normal function. The left ventricle has no regional wall motion abnormalities. The left ventricular internal cavity size was normal in size. There is  no left ventricular hypertrophy. Left ventricular diastolic parameters are indeterminate. Right Ventricle: The right ventricular size is normal. No increase in right ventricular wall thickness. Right ventricular systolic function is normal. Left Atrium: Left atrial size was normal in size. Right Atrium: Right atrial size was normal in size. Pericardium: There is no evidence of pericardial effusion. Mitral Valve: The mitral valve is normal in structure. Trivial mitral valve regurgitation. No evidence of mitral valve stenosis. Tricuspid Valve: The tricuspid valve is normal in structure. Tricuspid valve regurgitation is not demonstrated. No evidence of tricuspid stenosis. Aortic Valve: The aortic valve is normal in structure. Aortic valve regurgitation is not visualized. No aortic stenosis is present. Aortic valve mean gradient measures 4.0 mmHg. Aortic valve peak gradient measures 7.0 mmHg. Pulmonic Valve: The  pulmonic valve was normal in structure. Pulmonic valve regurgitation is not visualized. No evidence of pulmonic stenosis. Aorta: The aortic root is normal in size and structure. Venous: The inferior vena cava is normal in size with greater than 50% respiratory variability, suggesting right atrial pressure of 3 mmHg. IAS/Shunts: No atrial level shunt detected by color flow Doppler.  LEFT VENTRICLE PLAX 2D LVIDd:         3.70 cm Diastology LVIDs:         2.40 cm LV e' medial:    8.05 cm/s LV PW:         1.20 cm LV E/e' medial:  14.3 LV IVS:        1.00 cm LV e' lateral:   10.80 cm/s  LV E/e' lateral: 10.6  RIGHT VENTRICLE             IVC RV S prime:     14.10 cm/s  IVC diam: 2.10 cm TAPSE (M-mode): 2.9 cm LEFT ATRIUM             Index        RIGHT ATRIUM           Index LA Vol (A2C):   31.3 ml 19.78 ml/m  RA Area:     13.20 cm LA Vol (A4C):   23.7 ml 14.98 ml/m  RA Volume:   32.90 ml  20.79 ml/m LA Biplane Vol: 29.7 ml 18.77 ml/m  AORTIC VALVE AV Vmax:           132.00 cm/s AV Vmean:          88.200 cm/s AV VTI:            0.288 m AV Peak Grad:      7.0 mmHg AV Mean Grad:      4.0 mmHg LVOT Vmax:         139.00 cm/s LVOT Vmean:        85.200 cm/s LVOT VTI:          0.296 m LVOT/AV VTI ratio: 1.03  AORTA Ao Asc diam: 2.80 cm MITRAL VALVE                TRICUSPID VALVE MV Area (PHT): 3.99 cm     TR Peak grad:   22.3 mmHg MV Decel Time: 190 msec     TR Vmax:        236.00 cm/s MV E velocity: 115.00 cm/s MV A velocity: 111.00 cm/s  SHUNTS MV E/A ratio:  1.04         Systemic VTI: 0.30 m Lavona Mound Tobb DO Electronically signed by Thomasene Ripple DO Signature Date/Time: 11/20/2022/2:59:53 PM    Final    MR TIBIA FIBULA LEFT W WO CONTRAST  Result Date: 11/20/2022 CLINICAL DATA:  History of venous insufficiency. Chronic bilateral lower extremity edema. EXAM: MRI OF LOWER LEFT EXTREMITY WITHOUT AND WITH CONTRAST TECHNIQUE: Multiplanar, multisequence MR imaging of the left was performed both before and  after administration of intravenous contrast. CONTRAST:  6.71mL GADAVIST GADOBUTROL 1 MMOL/ML IV SOLN COMPARISON:  Radiograph dated November 19, 2022 FINDINGS: Bones/Joint/Cartilage Marrow signal is within normal limits. No fracture or osteonecrosis. Ligaments Intact Muscles and Tendons Muscles are normal in bulk. No intramuscular hematoma. No abnormal enhancement. Mild edema of the medial and lateral head of the gastrocnemius without evidence of fluid collection or abnormal enhancement. Tendons of the flexor, extensor and peroneal compartments appear intact. Soft tissues There is marked skin thickening and subcutaneous soft tissue edema. No fluid collection or abscess. IMPRESSION: 1. Marked skin thickening and subcutaneous soft tissue edema consistent with cellulitis. No fluid collection or abscess. 2. Mild edema of the medial and lateral head of the gastrocnemius muscles without evidence of fluid collection or abnormal enhancement. 3. No evidence of osteomyelitis. Electronically Signed   By: Larose Hires D.O.   On: 11/20/2022 12:39   DG Tibia/Fibula Left  Result Date: 11/19/2022 CLINICAL DATA:  Questionable sepsis - evaluate for abnormality EXAM: LEFT TIBIA AND FIBULA - 2 VIEW COMPARISON:  January 28, 2019 FINDINGS: No acute fracture or dislocation. Joint spaces and alignment are maintained. No area of erosion or osseous destruction. No unexpected radiopaque foreign body. Soft tissue edema. IMPRESSION: 1. No acute fracture or dislocation. 2. Soft  tissue edema. Electronically Signed   By: Meda Klinefelter M.D.   On: 11/19/2022 21:29   DG Chest Port 1 View  Result Date: 11/19/2022 CLINICAL DATA:  Questionable sepsis - evaluate for abnormality EXAM: PORTABLE CHEST 1 VIEW COMPARISON:  February 17, 2019 FINDINGS: The cardiomediastinal silhouette is unchanged in contour.Atherosclerotic calcifications of the tortuous thoracic aorta. No pleural effusion. No pneumothorax. No acute pleuroparenchymal abnormality.  IMPRESSION: No acute cardiopulmonary abnormality. Electronically Signed   By: Meda Klinefelter M.D.   On: 11/19/2022 21:29   - pertinent xrays, CT, MRI studies were reviewed and independently interpreted  Positive ROS: All other systems have been reviewed and were otherwise negative with the exception of those mentioned in the HPI and as above.  Physical Exam: General: Alert, no acute distress Psychiatric: Patient is competent for consent with normal mood and affect Lymphatic: No axillary or cervical lymphadenopathy Cardiovascular: No pedal edema Respiratory: No cyanosis, no use of accessory musculature GI: No organomegaly, abdomen is soft and non-tender    Images:  @ENCIMAGES @  Labs:  Lab Results  Component Value Date   REPTSTATUS PENDING 11/19/2022   CULT  11/19/2022    NO GROWTH < 12 HOURS Performed at [facility], [address] [place], [place] [address]    LABORGA PSEUDOMONAS AERUGINOSA (A) 03/17/2019   LABORGA KLEBSIELLA PNEUMONIAE (A) 03/17/2019    Lab Results  Component Value Date   ALBUMIN 2.6 (L) 11/20/2022   ALBUMIN 3.3 (L) 11/19/2022   ALBUMIN 1.8 (L) 03/17/2019        Latest Ref Rng & Units 11/21/2022    3:19 AM 11/20/2022    8:00 AM 11/19/2022   11:25 PM  CBC EXTENDED  WBC 4.0 - 10.5 K/uL 7.0  7.7    RBC 3.87 - 5.11 MIL/uL 3.95  3.99    Hemoglobin 12.0 - 15.0 g/dL 91.4  78.2  95.6   HCT 36.0 - 46.0 % 37.5  37.5  39.0   Platelets 150 - 400 K/uL 175  175      Neurologic: Patient does not have protective sensation bilateral lower extremities.   MUSCULOSKELETAL:   Skin: Examination patient has dermatitis and cellulitis and swelling of the left lower extremity.  There is some interval wrinkling of the skin with decreased swelling.  Patient also has brawny skin color changes of the right lower extremity without cellulitis on the right.  Patient has a strong palpable dorsalis pedis pulse bilaterally.  Review of the MRI scan shows no  evidence of abscess or osteomyelitis.  The MRI scan is consistent with cellulitis.  Assessment: Assessment: Cellulitis swelling venous and lymphatic insufficiency left lower extremity.  Plan: Plan for wearing a compression sock 24 hours a day.  Orders are placed for compression sock this is to be changed daily.  Once cellulitis has resolved patient may discharge to home with continued daily use of the compression socks with changing the sock daily.  Thank you for the consult and the opportunity to see Catherine Wallace  Aldean Baker, MD [facility] [phone] 8:10 AM

## 2022-11-21 NOTE — Progress Notes (Signed)
Ok to change cefepime to ceftriaxone per Dr. Myriam Forehand.  Ulyses Southward, PharmD, BCIDP, AAHIVP, CPP Infectious Disease Pharmacist 11/21/2022 11:33 AM

## 2022-11-22 DIAGNOSIS — L03116 Cellulitis of left lower limb: Secondary | ICD-10-CM | POA: Diagnosis not present

## 2022-11-22 LAB — CULTURE, BLOOD (ROUTINE X 2)

## 2022-11-22 MED ORDER — MIDODRINE HCL 5 MG PO TABS
5.0000 mg | ORAL_TABLET | Freq: Three times a day (TID) | ORAL | Status: DC
  Administered 2022-11-22 – 2022-11-24 (×7): 5 mg via ORAL
  Filled 2022-11-22 (×7): qty 1

## 2022-11-22 NOTE — Progress Notes (Addendum)
Progress Note    [name]  WUJ:[id number] DOB: 09/19/1950  DOA: 11/19/2022 PCP: Gordan Payment., MD      Brief Narrative:    Medical records reviewed and are as summarized below:  Catherine Wallace is a 72 y.o. female with history of COPD, chronic bilateral venous insufficiency of lower extremities, chronic back pain, hypothyroidism who presented with left lower extremity swelling, redness, pain which has been going on for last 2 weeks, not responding to outpatient antibiotic therapy. There was weeping and drainage with edema from the left leg.  She also complained of rash that developed about 2 days prior to admission.         Assessment/Plan:   Principal Problem:   Cellulitis Active Problems:   Acute renal failure (ARF) (HCC)   Rash   Dehydration   Sepsis (HCC)   Hypothyroidism   Tobacco abuse   COPD (chronic obstructive pulmonary disease) (HCC)   Venous insufficiency (chronic) (peripheral)   Lymphedema   Nutrition Problem: Increased nutrient needs Etiology: acute illness  Signs/Symptoms: estimated needs   Body mass index is 32.03 kg/m.  (Obesity)   Septic shock, severe sepsis secondary to left lower extremity cellulitis, chronic venous insufficiency: BP has improved.  Taper off midodrine as able (decreased from 10 mg to 5 mg 3 times daily). Continue IV vancomycin and ceftriaxone.  Analgesics as needed for pain.  Appreciate input from orthopedic surgeon. She had failed outpatient Keflex. Venous duplex negative for DVT and MRI of the leg did not show any evidence of abscess.   AKI: Creatinine has improved.   Hypomagnesemia: Improved   Diffuse erythematous maculopapular rash on the trunk, upper and lower extremities: Atarax as needed.  Hydrocortisone cream as needed.  COPD, chronic hypoxic respiratory failure: Unable to wean off oxygen. Oxygen saturation on room air while sitting at the edge of the bed and standing was 87%.  Oxygen saturation while  ambulating on 2 L/min oxygen was 92%.  She may have to be discharged home on oxygen.      Diet Order             Diet regular Room service appropriate? Yes with Assist; Fluid consistency: Thin  Diet effective now                            Consultants: Orthopedic surgeon Intensivist  Procedures: None    Medications:    feeding supplement  237 mL Oral BID BM   levothyroxine  25 mcg Oral QAC breakfast   midodrine  10 mg Oral TID WC   multivitamin with minerals  1 tablet Oral Daily   Continuous Infusions:  cefTRIAXone (ROCEPHIN)  IV 2 g (11/21/22 1733)   vancomycin 1,000 mg (11/21/22 2020)     Anti-infectives (From admission, onward)    Start     Dose/Rate Route Frequency Ordered Stop   11/21/22 2100  vancomycin (VANCOCIN) IVPB 1000 mg/200 mL premix        1,000 mg 200 mL/hr over 60 Minutes Intravenous Every 48 hours 11/20/22 0948     11/21/22 1800  cefTRIAXone (ROCEPHIN) 2 g in sodium chloride 0.9 % 100 mL IVPB        2 g 200 mL/hr over 30 Minutes Intravenous Every 24 hours 11/21/22 1132     11/20/22 2100  ceFEPIme (MAXIPIME) 2 g in sodium chloride 0.9 % 100 mL IVPB  Status:  Discontinued  2 g 200 mL/hr over 30 Minutes Intravenous Every 24 hours 11/19/22 2256 11/20/22 0948   11/20/22 1000  ceFEPIme (MAXIPIME) 2 g in sodium chloride 0.9 % 100 mL IVPB  Status:  Discontinued        2 g 200 mL/hr over 30 Minutes Intravenous Every 12 hours 11/20/22 0948 11/21/22 1132   11/19/22 2256  vancomycin variable dose per unstable renal function (pharmacist dosing)  Status:  Discontinued         Does not apply See admin instructions 11/19/22 2256 11/20/22 0948   11/19/22 2030  vancomycin (VANCOCIN) IVPB 1000 mg/200 mL premix        1,000 mg 200 mL/hr over 60 Minutes Intravenous  Once 11/19/22 2026 11/19/22 2224   11/19/22 2030  ceFEPIme (MAXIPIME) 2 g in sodium chloride 0.9 % 100 mL IVPB        2 g 200 mL/hr over 30 Minutes Intravenous  Once 11/19/22 2026  11/19/22 2122              Family Communication/Anticipated D/C date and plan/Code Status   DVT prophylaxis: SCDs Start: 11/20/22 4098     Code Status: DNR  Family Communication: None Disposition Plan: Plan to discharge to SNF   Status is: Inpatient Remains inpatient appropriate because: On IV antibiotics for cellulitis       Subjective:   Interval events noted.  No new complaints.  She still has pain in the left leg although it is a little better today.  Diffuse rash on her body is not as itchy as it was yesterday.  Objective:    Vitals:   11/22/22 0000 11/22/22 0501 11/22/22 0700 11/22/22 0823  BP: 114/60 (!) 118/58  (!) 107/50  Pulse:    96  Resp: 16 (!) 21  18  Temp:  98.7 F (37.1 C)  99.2 F (37.3 C)  TempSrc:  Oral  Oral  SpO2:   92% 99%  Weight:      Height:       No data found.   Intake/Output Summary (Last 24 hours) at 11/22/2022 0926 Last data filed at 11/22/2022 0828 Gross per 24 hour  Intake 1180 ml  Output 200 ml  Net 980 ml   Filed Weights   11/19/22 2046  Weight: 67.1 kg    Exam:  GEN: NAD SKIN: Warm and dry.  Diffuse maculopapular rash on the trunk, upper and lower extremities EYES: No pallor or icterus ENT: MMM CV: RRR PULM: CTA B ABD: soft, obese, NT, +BS CNS: AAO x 3, non focal EXT: Left leg and foot are warm to touch, swollen, erythematous and tender.     Data Reviewed:   I have personally reviewed following labs and imaging studies:  Labs: Labs show the following:   Basic Metabolic Panel: Recent Labs  Lab 11/19/22 1847 11/19/22 2315 11/19/22 2325 11/20/22 0800 11/21/22 0319  NA 133*  --  131* 133* 133*  K 4.4  --  4.1 4.1 4.1  CL 98  --   --  99 97*  CO2 19*  --   --  25 23  GLUCOSE 161*  --   --  94 87  BUN 26*  --   --  17 14  CREATININE 1.44*  --   --  1.00 0.88  CALCIUM 9.4  --   --  8.1* 8.2*  MG  --  1.6*  --  1.8  --   PHOS  --  2.4*  --  2.9  --    GFR Estimated Creatinine  Clearance: 44.9 mL/min (by C-G formula based on SCr of 0.88 mg/dL). Liver Function Tests: Recent Labs  Lab 11/19/22 1847 11/20/22 0800  AST 30 45*  ALT 14 18  ALKPHOS 107 78  BILITOT 0.5 0.5  PROT 6.6 5.1*  ALBUMIN 3.3* 2.6*   No results for input(s): "LIPASE", "AMYLASE" in the last 168 hours. No results for input(s): "AMMONIA" in the last 168 hours. Coagulation profile Recent Labs  Lab 11/19/22 2315  INR 1.1    CBC: Recent Labs  Lab 11/19/22 1951 11/19/22 2325 11/20/22 0800 11/21/22 0319  WBC 9.7  --  7.7 7.0  NEUTROABS 8.3*  --   --   --   HGB 14.4 13.3 12.8 12.6  HCT 43.2 39.0 37.5 37.5  MCV 92.7  --  94.0 94.9  PLT 212  --  175 175   Cardiac Enzymes: Recent Labs  Lab 11/19/22 2315  CKTOTAL 155   BNP (last 3 results) No results for input(s): "PROBNP" in the last 8760 hours. CBG: No results for input(s): "GLUCAP" in the last 168 hours. D-Dimer: No results for input(s): "DDIMER" in the last 72 hours. Hgb A1c: No results for input(s): "HGBA1C" in the last 72 hours. Lipid Profile: No results for input(s): "CHOL", "HDL", "LDLCALC", "TRIG", "CHOLHDL", "LDLDIRECT" in the last 72 hours. Thyroid function studies: Recent Labs    11/19/22 2315  TSH 1.049   Anemia work up: No results for input(s): "VITAMINB12", "FOLATE", "FERRITIN", "TIBC", "IRON", "RETICCTPCT" in the last 72 hours. Sepsis Labs: Recent Labs  Lab 11/19/22 1951 11/19/22 2217 11/19/22 2315 11/20/22 0124 11/20/22 0800 11/21/22 0319  PROCALCITON  --   --  0.16  --   --   --   WBC 9.7  --   --   --  7.7 7.0  LATICACIDVEN 3.1* 2.5*  --  1.1  --   --     Microbiology Recent Results (from the past 240 hour(s))  Blood Culture (routine x 2)     Status: None (Preliminary result)   Collection Time: 11/19/22  8:25 PM   Specimen: BLOOD  Result Value Ref Range Status   Specimen Description BLOOD RIGHT ANTECUBITAL  Final   Special Requests   Final    BOTTLES DRAWN AEROBIC AND ANAEROBIC Blood  Culture results may not be optimal due to an excessive volume of blood received in culture bottles   Culture   Final    NO GROWTH 2 DAYS Performed at [facility], [address] [place], [place] [address]    Report Status PENDING  Incomplete  Blood Culture (routine x 2)     Status: None (Preliminary result)   Collection Time: 11/19/22  8:35 PM   Specimen: BLOOD  Result Value Ref Range Status   Specimen Description BLOOD LEFT ANTECUBITAL  Final   Special Requests   Final    BOTTLES DRAWN AEROBIC AND ANAEROBIC Blood Culture results may not be optimal due to an inadequate volume of blood received in culture bottles   Culture   Final    NO GROWTH 2 DAYS Performed at [facility], [address], [place], [place] [address]    Report Status PENDING  Incomplete  MRSA Next Gen by PCR, Nasal     Status: None   Collection Time: 11/20/22 12:45 AM   Specimen: Nasal Mucosa; Nasal Swab  Result Value Ref Range Status   MRSA by PCR Next Gen  NOT DETECTED NOT DETECTED Final    Comment: (NOTE) The GeneXpert MRSA Assay (FDA approved for NASAL specimens only), is one component of a comprehensive MRSA colonization surveillance program. It is not intended to diagnose MRSA infection nor to guide or monitor treatment for MRSA infections. Test performance is not FDA approved in patients less than 35 years old. Performed at [facility], [address] [place], [place] [address]     Procedures and diagnostic studies:  ECHOCARDIOGRAM COMPLETE  Result Date: 11/20/2022    ECHOCARDIOGRAM REPORT   Patient Name:   SHENOA Firkus Date of Exam: 11/20/2022 Medical Rec #:  [id number]  Height:       57.0 in Accession #:    [id number] Weight:       148.0 lb Date of Birth:  10-22-1950   BSA:          1.583 m Patient Age:    72 years   BP:           88/53 mmHg Patient Gender: F          HR:           69 bpm. Exam Location:  Inpatient Procedure: 2D Echo, Cardiac Doppler and Color Doppler Indications:     Fever  History:        Patient has no prior history of Echocardiogram examinations.                 COPD.  Sonographer:    Darlys Gales Referring Phys: [id number] ANASTASSIA DOUTOVA IMPRESSIONS  1. Left ventricular ejection fraction, by estimation, is 60 to 65%. The left ventricle has normal function. The left ventricle has no regional wall motion abnormalities. Left ventricular diastolic parameters are indeterminate.  2. Right ventricular systolic function is normal. The right ventricular size is normal.  3. The mitral valve is normal in structure. Trivial mitral valve regurgitation. No evidence of mitral stenosis.  4. The aortic valve is normal in structure. Aortic valve regurgitation is not visualized. No aortic stenosis is present.  5. The inferior vena cava is normal in size with greater than 50% respiratory variability, suggesting right atrial pressure of 3 mmHg. FINDINGS  Left Ventricle: Left ventricular ejection fraction, by estimation, is 60 to 65%. The left ventricle has normal function. The left ventricle has no regional wall motion abnormalities. The left ventricular internal cavity size was normal in size. There is  no left ventricular hypertrophy. Left ventricular diastolic parameters are indeterminate. Right Ventricle: The right ventricular size is normal. No increase in right ventricular wall thickness. Right ventricular systolic function is normal. Left Atrium: Left atrial size was normal in size. Right Atrium: Right atrial size was normal in size. Pericardium: There is no evidence of pericardial effusion. Mitral Valve: The mitral valve is normal in structure. Trivial mitral valve regurgitation. No evidence of mitral valve stenosis. Tricuspid Valve: The tricuspid valve is normal in structure. Tricuspid valve regurgitation is not demonstrated. No evidence of tricuspid stenosis. Aortic Valve: The aortic valve is normal in structure. Aortic valve regurgitation is not visualized. No aortic stenosis is  present. Aortic valve mean gradient measures 4.0 mmHg. Aortic valve peak gradient measures 7.0 mmHg. Pulmonic Valve: The pulmonic valve was normal in structure. Pulmonic valve regurgitation is not visualized. No evidence of pulmonic stenosis. Aorta: The aortic root is normal in size and structure. Venous: The inferior vena cava is normal in size with greater than 50% respiratory variability, suggesting right atrial pressure of 3 mmHg. IAS/Shunts:  No atrial level shunt detected by color flow Doppler.  LEFT VENTRICLE PLAX 2D LVIDd:         3.70 cm Diastology LVIDs:         2.40 cm LV e' medial:    8.05 cm/s LV PW:         1.20 cm LV E/e' medial:  14.3 LV IVS:        1.00 cm LV e' lateral:   10.80 cm/s                        LV E/e' lateral: 10.6  RIGHT VENTRICLE             IVC RV S prime:     14.10 cm/s  IVC diam: 2.10 cm TAPSE (M-mode): 2.9 cm LEFT ATRIUM             Index        RIGHT ATRIUM           Index LA Vol (A2C):   31.3 ml 19.78 ml/m  RA Area:     13.20 cm LA Vol (A4C):   23.7 ml 14.98 ml/m  RA Volume:   32.90 ml  20.79 ml/m LA Biplane Vol: 29.7 ml 18.77 ml/m  AORTIC VALVE AV Vmax:           132.00 cm/s AV Vmean:          88.200 cm/s AV VTI:            0.288 m AV Peak Grad:      7.0 mmHg AV Mean Grad:      4.0 mmHg LVOT Vmax:         139.00 cm/s LVOT Vmean:        85.200 cm/s LVOT VTI:          0.296 m LVOT/AV VTI ratio: 1.03  AORTA Ao Asc diam: 2.80 cm MITRAL VALVE                TRICUSPID VALVE MV Area (PHT): 3.99 cm     TR Peak grad:   22.3 mmHg MV Decel Time: 190 msec     TR Vmax:        236.00 cm/s MV E velocity: 115.00 cm/s MV A velocity: 111.00 cm/s  SHUNTS MV E/A ratio:  1.04         Systemic VTI: 0.30 m Lavona Mound Tobb DO Electronically signed by Thomasene Ripple DO Signature Date/Time: 11/20/2022/2:59:53 PM    Final    MR TIBIA FIBULA LEFT W WO CONTRAST  Result Date: 11/20/2022 CLINICAL DATA:  History of venous insufficiency. Chronic bilateral lower extremity edema. EXAM: MRI OF LOWER LEFT  EXTREMITY WITHOUT AND WITH CONTRAST TECHNIQUE: Multiplanar, multisequence MR imaging of the left was performed both before and after administration of intravenous contrast. CONTRAST:  6.31mL GADAVIST GADOBUTROL 1 MMOL/ML IV SOLN COMPARISON:  Radiograph dated November 19, 2022 FINDINGS: Bones/Joint/Cartilage Marrow signal is within normal limits. No fracture or osteonecrosis. Ligaments Intact Muscles and Tendons Muscles are normal in bulk. No intramuscular hematoma. No abnormal enhancement. Mild edema of the medial and lateral head of the gastrocnemius without evidence of fluid collection or abnormal enhancement. Tendons of the flexor, extensor and peroneal compartments appear intact. Soft tissues There is marked skin thickening and subcutaneous soft tissue edema. No fluid collection or abscess. IMPRESSION: 1. Marked skin thickening and subcutaneous soft tissue edema consistent with cellulitis. No fluid collection or abscess. 2. Mild edema of  the medial and lateral head of the gastrocnemius muscles without evidence of fluid collection or abnormal enhancement. 3. No evidence of osteomyelitis. Electronically Signed   By: Larose Hires D.O.   On: 11/20/2022 12:39               LOS: 3 days   Javius Sylla  [facility]   Pager on [email or web address]. If 7PM-7AM, please contact night-coverage at [email or web address]     11/22/2022, 9:26 AM

## 2022-11-22 NOTE — Progress Notes (Signed)
Physical Therapy Treatment Patient Details Name: Catherine Wallace MRN: [id number] DOB: 1950/10/21 Today's Date: 11/22/2022   History of Present Illness 72 y.o. female presents to [facility] hospital on 11/19/2022 with LLE swelling, edema and drainage which began about 2 weeks prior to admission. She also complained of rash that developed about 2 days prior to admission. PMH includes COPD, chronic venous insufficiency, chronic back pain, hypothyroidism.    PT Comments    Pt received in supine, reluctant to mobilize due to c/o fatigue and LLE pain, however agreeable to therapy session with encouragement from PTA after discussion on disposition plan. Pt reports preference to get stronger and go home but admits to limited support available since she lives alone and unable to name someone who could stay with her 24/7, so agreeable to consider short term lower intensity post-acute therapies upon DC, case mgmt/social workers notified. Pt needing up to modA for transfers due to LLE pain/fatigue and up to +2 minA (for safety) for step pivot from bed to chair on her L side. Pt very fatigued and reporting 10/10 pain (RN gave IV meds during session) and unable to progress gait this date. Pt continues to benefit from PT services to progress toward functional mobility goals, at baseline pt needs to walk ~40-32ft to get to/from bathroom and recliner where she sleeps and to perform all her iADLS/ADLs on her own, currently pt unable to care for herself without significant assist.    Recommendations for follow up therapy are one component of a multi-disciplinary discharge planning process, led by the attending physician.  Recommendations may be updated based on patient status, additional functional criteria and insurance authorization.  Follow Up Recommendations  Can patient physically be transported by private vehicle: No    Assistance Recommended at Discharge Intermittent Supervision/Assistance  Patient can return home with the  following A lot of help with walking and/or transfers;A lot of help with bathing/dressing/bathroom;Assistance with cooking/housework;Assist for transportation;Help with stairs or ramp for entrance   Equipment Recommendations  Other (comment) (TBD pending progress)    Recommendations for Other Services       Precautions / Restrictions Precautions Precautions: Fall Precaution Comments: watch O2, LLE pain Restrictions Weight Bearing Restrictions: No     Mobility  Bed Mobility Overal bed mobility: Needs Assistance Bed Mobility: Rolling, Sidelying to Sit Rolling: Min assist Sidelying to sit: Mod assist, HOB elevated       General bed mobility comments: to R EOB, pt mainly limited due to BLE pain (L>R) and c/o some back pain, pt initially reluctant to take pain meds but once EOB, pt agreeable. Pt sleeps in recliner at home so kept HOB elevated and pt very heavily reliant on R bed rail to get to R EOB.    Transfers Overall transfer level: Needs assistance Equipment used: Rolling walker (2 wheels) Transfers: Sit to/from Stand, Bed to chair/wheelchair/BSC Sit to Stand: Min assist, Mod assist, From elevated surface   Step pivot transfers: Min assist, From elevated surface, +2 safety/equipment       General transfer comment: STS transfer from slightly elevated EOB using RW with c/o severe LLE pain and pt had to sit, then on second attempt, pt able to pivot ~3 steps to her L to sit on recliner, pt unable to tolerate more standing due to c/o severe pain, despite IV meds given just prior to standing trials. Once in chair, pt with episode nausea/dry heaves, RN notified.    Ambulation/Gait  Pre-gait activities: pivotal steps toward her L side with RW only; L>R LE pain limiting standing tolerance        Balance Overall balance assessment: Needs assistance Sitting-balance support: No upper extremity supported, Feet unsupported Sitting balance-Leahy Scale:  Fair Sitting balance - Comments: sitting EOB   Standing balance support: Bilateral upper extremity supported, Reliant on assistive device for balance Standing balance-Leahy Scale: Poor Standing balance comment: BUE support on RW for stability                            Cognition Arousal/Alertness: Awake/alert Behavior During Therapy: WFL for tasks assessed/performed Overall Cognitive Status: Within Functional Limits for tasks assessed                                 General Comments: A+O x4, Some decreased insight into deficits initially but seems to be understanding as she moves that she will be needing more assist than previously. Needs encouragement to initiate all tasks.        Exercises Other Exercises Other Exercises: supine BLE AROM: ankle pumps, AAROM for hip abduction and LAQ x 5 reps ea (pain limiting) Other Exercises: vcs for GS/QS, pt would benefit from bed level therex handout to reinforce next session    General Comments General comments (skin integrity, edema, etc.): Pt with small dime shaped rashes all over trunk, arms, legs. Pt SpO2 desat to 87% on RA (received on RA with SpO2 89-90%), needed 2L O2 [place] to maintain SpO2 at goal of 92% and greater. HR 70's bpm seated and BP 110/58 (71) once in recliner. Episode nausea/dry heaves in chair after RN gave IV meds, pt given blue bag, RN aware.      Pertinent Vitals/Pain Pain Assessment Pain Assessment: 0-10 Pain Score: 10-Worst pain ever Faces Pain Scale: Hurts whole lot Pain Location: LLE, generalized Pain Descriptors / Indicators: Discomfort, Grimacing, Guarding, Burning Pain Intervention(s): Limited activity within patient's tolerance, Monitored during session, Repositioned, RN gave pain meds during session           PT Goals (current goals can now be found in the care plan section) Acute Rehab PT Goals Patient Stated Goal: to regain strength, return to independence PT Goal Formulation:  With patient Time For Goal Achievement: 12/04/22 Progress towards PT goals: Progressing toward goals (slowly)    Frequency    Min 2X/week      PT Plan Current plan remains appropriate       AM-PAC PT "6 Clicks" Mobility   Outcome Measure  Help needed turning from your back to your side while in a flat bed without using bedrails?: A Little Help needed moving from lying on your back to sitting on the side of a flat bed without using bedrails?: A Lot Help needed moving to and from a bed to a chair (including a wheelchair)?: A Lot Help needed standing up from a chair using your arms (e.g., wheelchair or bedside chair)?: A Lot Help needed to walk in hospital room?: Total Help needed climbing 3-5 steps with a railing? : Total 6 Click Score: 11    End of Session Equipment Utilized During Treatment: Gait belt;Oxygen Activity Tolerance: Patient limited by pain;Patient limited by fatigue Patient left: in chair;with call bell/phone within reach;with chair alarm set;Other (comment) (reclined, set up with lunch tray in front of her) Nurse Communication: Mobility status;Other (comment) (nausea/dry heaves; +1-2 assist  with RW to pivot) PT Visit Diagnosis: Other abnormalities of gait and mobility (R26.89);Muscle weakness (generalized) (M62.81)     Time: 1478-2956 PT Time Calculation (min) (ACUTE ONLY): 39 min  Charges:  $Therapeutic Exercise: 8-22 mins $Therapeutic Activity: 23-37 mins                     Quanasia Defino P., PTA Acute Rehabilitation Services Secure Chat Preferred 9a-5:30pm Office: [phone]    Dorathy Kinsman [facility] 11/22/2022, 3:00 PM

## 2022-11-22 NOTE — TOC Progression Note (Signed)
Transition of Care [facility]) - Progression Note    Patient Details  Name: Catherine Wallace MRN: [id number] Date of Birth: November 23, 1950  Transition of Care [facility]) CM/SW Contact  Mearl Latin, LCSW Phone Number: 11/22/2022, 2:28 PM  Clinical Narrative:    Patient interested in SNF backup but is still hopeful to return home. CSW sent out referral for review.    Expected Discharge Plan: Home w Home Health Services Barriers to Discharge: Continued Medical Work up  Expected Discharge Plan and Services In-house Referral: Clinical Social Work Discharge Planning Services: NA Post Acute Care Choice: NA Living arrangements for the past 2 months: Single Family Home                 DME Arranged: N/A (Recs TBD) DME Agency: NA (Recs TBD)       HH Arranged:  (RECS TBD) HH Agency:  (RECS TBD)         Social Determinants of Health (SDOH) Interventions SDOH Screenings   Tobacco Use: High Risk (11/20/2022)    Readmission Risk Interventions     No data to display

## 2022-11-22 NOTE — [place] FL2 (Signed)
[place] MEDICAID FL2 LEVEL OF CARE FORM     IDENTIFICATION  Patient Name: Catherine Wallace Birthdate: [birth year] Sex: female Admission Date (Current Location): 11/19/2022  [facility] and [place] Number:  Producer, television/film/video and Address:  [facility] [place], [address] [place], [place] [address]      Provider Number: [id number]  Attending Physician Name and Address:  Lurene Shadow, MD  Relative Name and Phone Number:       Current Level of Care: Hospital Recommended Level of Care: Skilled Nursing Facility Prior Approval Number:    Date Approved/Denied:   PASRR Number: [id number]  Discharge Plan: SNF    Current Diagnoses: Patient Active Problem List   Diagnosis Date Noted   Venous insufficiency (chronic) (peripheral) 11/21/2022   Lymphedema 11/21/2022   Cellulitis 11/19/2022   Dehydration 11/19/2022   Sepsis (HCC) 11/19/2022   Hypothyroidism 11/19/2022   Tobacco abuse 11/19/2022   COPD (chronic obstructive pulmonary disease) (HCC) 11/19/2022   Acute renal failure (ARF) (HCC) 03/04/2019   AKI (acute kidney injury) (HCC)    Fall    Localized skin desquamation    Rash     Orientation RESPIRATION BLADDER Height & Weight     Self, Time, Situation, Place  O2 (2L nasal cannula) Incontinent Weight: 148 lb (67.1 kg) Height:  4\' 9"  (144.8 cm)  BEHAVIORAL SYMPTOMS/MOOD NEUROLOGICAL BOWEL NUTRITION STATUS      Continent Diet (See DC Summary)  AMBULATORY STATUS COMMUNICATION OF NEEDS Skin   Extensive Assist Verbally Normal                       Personal Care Assistance Level of Assistance  Bathing, Feeding, Dressing Bathing Assistance: Limited assistance Feeding assistance: Independent Dressing Assistance: Limited assistance     Functional Limitations Info             SPECIAL CARE FACTORS FREQUENCY  PT (By licensed PT), OT (By licensed OT)     PT Frequency: 5x/week OT Frequency: 5x/week            Contractures Contractures  Info: Not present    Additional Factors Info  Code Status, Allergies Code Status Info: DNR Allergies Info: Tape, Penicillins, Amoxicillin, Hydrocodone, Sulfamethoxazole-trimethoprim           Current Medications (11/22/2022):  This is the current hospital active medication list Current Facility-Administered Medications  Medication Dose Route Frequency Provider Last Rate Last Admin   acetaminophen (TYLENOL) tablet 650 mg  650 mg Oral Q6H PRN Therisa Doyne, MD   650 mg at 11/20/22 1555   Or   acetaminophen (TYLENOL) suppository 650 mg  650 mg Rectal Q6H PRN Doutova, Anastassia, MD       cefTRIAXone (ROCEPHIN) 2 g in sodium chloride 0.9 % 100 mL IVPB  2 g Intravenous Q24H Pham, Minh Q, RPH-CPP 200 mL/hr at 11/21/22 1733 2 g at 11/21/22 1733   feeding supplement (ENSURE ENLIVE / ENSURE PLUS) liquid 237 mL  237 mL Oral BID BM Lurene Shadow, MD   237 mL at 11/22/22 1348   fentaNYL (SUBLIMAZE) injection 12.5-50 mcg  12.5-50 mcg Intravenous Q2H PRN Therisa Doyne, MD   50 mcg at 11/22/22 1234   hydrOXYzine (ATARAX) tablet 10 mg  10 mg Oral TID PRN Lurene Shadow, MD       levothyroxine (SYNTHROID) tablet 25 mcg  25 mcg Oral QAC breakfast Therisa Doyne, MD   25 mcg at 11/22/22 0608   midodrine (PROAMATINE) tablet 10 mg  10 mg Oral TID WC Burnadette Pop, MD   10 mg at 11/22/22 1235   multivitamin with minerals tablet 1 tablet  1 tablet Oral Daily Lurene Shadow, MD   1 tablet at 11/22/22 1009   ondansetron (ZOFRAN) tablet 4 mg  4 mg Oral Q6H PRN Therisa Doyne, MD       Or   ondansetron (ZOFRAN) injection 4 mg  4 mg Intravenous Q6H PRN Doutova, Anastassia, MD   4 mg at 11/20/22 1221   prochlorperazine (COMPAZINE) injection 10 mg  10 mg Intravenous Q6H PRN Burnadette Pop, MD   10 mg at 11/20/22 1554   vancomycin (VANCOCIN) IVPB 1000 mg/200 mL premix  1,000 mg Intravenous Q48H Doristine Counter, RPH 200 mL/hr at 11/21/22 2020 1,000 mg at 11/21/22 2020     Discharge  Medications: Please see discharge summary for a list of discharge medications.  Relevant Imaging Results:  Relevant Lab Results:   Additional Information ss#[id number]  Mearl Latin, LCSW

## 2022-11-22 NOTE — Progress Notes (Signed)
SATURATION QUALIFICATIONS: (This note is used to comply with regulatory documentation for home oxygen)  Patient Saturations on Room Air at Rest = 89%  Patient Saturations on Room Air while sitting edge of bed and standing = 87%  Patient Saturations on 2 Liters of oxygen while Ambulating = 92%  Please briefly explain why patient needs home oxygen: Pt hypoxic on RA with minimal exertion, pt needing 2L O2 [place] to maintain SpO2 at or >92% which is her goal.

## 2022-11-22 NOTE — Care Management Important Message (Signed)
Important Message  Patient Details  Name: Catherine Wallace MRN: [id number] Date of Birth: 22-Jan-1951   Medicare Important Message Given:  Yes     Jasdeep Dejarnett Stefan Church 11/22/2022, 2:19 PM

## 2022-11-23 DIAGNOSIS — L0390 Cellulitis, unspecified: Secondary | ICD-10-CM | POA: Diagnosis not present

## 2022-11-23 DIAGNOSIS — A419 Sepsis, unspecified organism: Secondary | ICD-10-CM | POA: Diagnosis not present

## 2022-11-23 LAB — CULTURE, BLOOD (ROUTINE X 2): Culture: NO GROWTH

## 2022-11-23 MED ORDER — LINEZOLID 600 MG PO TABS
600.0000 mg | ORAL_TABLET | Freq: Two times a day (BID) | ORAL | Status: DC
  Administered 2022-11-23 – 2022-11-24 (×2): 600 mg via ORAL
  Filled 2022-11-23 (×3): qty 1

## 2022-11-23 NOTE — Progress Notes (Signed)
Occupational Therapy Treatment Patient Details Name: Catherine Wallace MRN: [id number] DOB: 19-Oct-1950 Today's Date: 11/23/2022   History of present illness 72 y.o. female presents to [facility] hospital on 11/19/2022 with LLE swelling, edema and drainage which began about 2 weeks prior to admission. She also complained of rash that developed about 2 days prior to admission. PMH includes COPD, chronic venous insufficiency, chronic back pain, hypothyroidism.   OT comments  Patient demonstrating gains with bed mobility, transfers, and standing at sink for self care. Patient requires increased time to perform most task due to activity tolerance. Patient stated she felt dizzy once on EOB with BP 117/54 and after returning to supine with BP 115/55. Discussed discharge plans and patient states she has not made up her mind yet on SNF vs home. Acute OT to continue to follow.    Recommendations for follow up therapy are one component of a multi-disciplinary discharge planning process, led by the attending physician.  Recommendations may be updated based on patient status, additional functional criteria and insurance authorization.    Assistance Recommended at Discharge Frequent or constant Supervision/Assistance  Patient can return home with the following  A little help with walking and/or transfers;A lot of help with bathing/dressing/bathroom;Assistance with cooking/housework;Assist for transportation;Help with stairs or ramp for entrance   Equipment Recommendations  Other (comment) (defer)    Recommendations for Other Services      Precautions / Restrictions Precautions Precautions: Fall Precaution Comments: watch O2, LLE pain Restrictions Weight Bearing Restrictions: No       Mobility Bed Mobility Overal bed mobility: Needs Assistance Bed Mobility: Supine to Sit, Sit to Supine     Supine to sit: Min assist Sit to supine: Min assist   General bed mobility comments: assistance with trunk and scooting  to EOB. Assistance with returning BLE into bed    Transfers Overall transfer level: Needs assistance Equipment used: Rolling walker (2 wheels) Transfers: Sit to/from Stand, Bed to chair/wheelchair/BSC Sit to Stand: Min assist     Step pivot transfers: Min assist     General transfer comment: min assist to stand from EOB and ambulate to sink     Balance Overall balance assessment: Needs assistance Sitting-balance support: No upper extremity supported, Feet unsupported Sitting balance-Leahy Scale: Fair     Standing balance support: Single extremity supported, Bilateral upper extremity supported, During functional activity Standing balance-Leahy Scale: Poor Standing balance comment: able to stand at sink for grooming tasks with one extremity support; reliant on BUE support for dynamic standing                           ADL either performed or assessed with clinical judgement   ADL Overall ADL's : Needs assistance/impaired     Grooming: Wash/dry hands;Wash/dry face;Brushing hair;Min guard;Standing Grooming Details (indicate cue type and reason): at sink                 Toilet Transfer: Minimal assistance;Rolling walker (2 wheels) Toilet Transfer Details (indicate cue type and reason): simulated                Extremity/Trunk Assessment              Vision       Perception     Praxis      Cognition Arousal/Alertness: Awake/alert Behavior During Therapy: WFL for tasks assessed/performed Overall Cognitive Status: Within Functional Limits for tasks assessed  General Comments: alert and oriented x4        Exercises      Shoulder Instructions       General Comments BP on EOB 117/54, in supine 115/55, SpO2 94 on 2 liters    Pertinent Vitals/ Pain       Pain Assessment Pain Assessment: Faces Faces Pain Scale: Hurts a little bit Pain Location: back, LLE, generalized Pain Descriptors /  Indicators: Discomfort, Grimacing, Guarding, Burning Pain Intervention(s): Monitored during session, Limited activity within patient's tolerance  Home Living                                          Prior Functioning/Environment              Frequency  Min 2X/week        Progress Toward Goals  OT Goals(current goals can now be found in the care plan section)  Progress towards OT goals: Progressing toward goals  Acute Rehab OT Goals Patient Stated Goal: get better OT Goal Formulation: With patient Time For Goal Achievement: 12/04/22 Potential to Achieve Goals: Good ADL Goals Pt Will Perform Grooming: with modified independence;standing Pt Will Perform Upper Body Bathing: with modified independence;sitting Pt Will Perform Lower Body Dressing: with min guard assist;sit to/from stand Pt Will Transfer to Toilet: with modified independence;bedside commode;stand pivot transfer Additional ADL Goal #1: Pt will complete bed mobility with min A in preparation for ADLs.  Plan Discharge plan remains appropriate    Co-evaluation                 AM-PAC OT "6 Clicks" Daily Activity     Outcome Measure   Help from another person eating meals?: A Little Help from another person taking care of personal grooming?: A Little Help from another person toileting, which includes using toliet, bedpan, or urinal?: A Little Help from another person bathing (including washing, rinsing, drying)?: A Lot Help from another person to put on and taking off regular upper body clothing?: A Little Help from another person to put on and taking off regular lower body clothing?: A Lot 6 Click Score: 16    End of Session Equipment Utilized During Treatment: Rolling walker (2 wheels);Gait belt;Oxygen  OT Visit Diagnosis: Unsteadiness on feet (R26.81);Muscle weakness (generalized) (M62.81);Pain Pain - part of body:  (back)   Activity Tolerance Patient tolerated treatment well    Patient Left in bed;with call bell/phone within reach;with bed alarm set   Nurse Communication Mobility status        Time: 1240-1310 OT Time Calculation (min): 30 min  Charges: OT General Charges $OT Visit: 1 Visit OT Treatments $Self Care/Home Management : 8-22 mins $Therapeutic Activity: 8-22 mins  Alfonse Flavors, OTA Acute Rehabilitation Services  Office [phone]   Dewain Penning 11/23/2022, 2:25 PM

## 2022-11-23 NOTE — Hospital Course (Signed)
72yo female with h/o COPD, chronic BLE venous insufficiency, chronic back pain, and hypothyroidism who presented on 6/24 with LLE cellulitis that failed outpatient antibiotics.  She was found to be in septic shock and required midodrine, now tapering.  Treated with Vanc/ceftriaxone.  DVT US and MRI negative.

## 2022-11-23 NOTE — Progress Notes (Signed)
Progress Note   Patient: Catherine Wallace ZOX:[id number] DOB: [birth year] DOA: 11/19/2022     4 DOS: the patient was seen and examined on 11/23/2022   Brief hospital course: 72yo female with h/o COPD, chronic BLE venous insufficiency, chronic back pain, and hypothyroidism who presented on 6/24 with LLE cellulitis that failed outpatient antibiotics.  She was found to be in septic shock and required midodrine, now tapering.  Treated with Vanc/ceftriaxone.  DVT US and MRI negative.    Assessment and Plan:   Septic shock associated with LLE cellulitis Patient with severe sepsis with hypotension on presentation Persistent LLE cellulitis despite treatment for 7 days prior with Bactrim and cephazolin Has stasis with dermatitis, easy portal of entry for infection BP has improved, tapering off midodrine as able (decreased from 10 mg to 5 mg 3 times daily) Venous duplex negative for DVT and MRI of the leg did not show any evidence of abscess Has been treated with IV vancomycin and ceftriaxone -> will transition to PO Zyvox at this time with hope for ongoing recovery and dc on this medication Appreciate input from orthopedic surgeon, Dr. Lajoyce Corners recommends compression socks (changing daily) Possible dc to home tomorrow if she has ongoing improvement  Rash Appears to be a drug rash, as it is diffuse on all exposed body surfaces It does not appear to be significantly improved despite cessation of offending agent (PCN and Bactrim allergies listed, patient does not think rash was present on admission so possibly from ceftriaxone?) Will stop parenteral abx and cover with linezolid monotherapy at this time  Continue hydroxyzine and hydrocortisone cream as needed for itching  AKI  Creatinine has improved/normalized Attempt to avoid nephrotoxic medications Recheck BMP in AM   COPD Does not wear home O2 Has chronic cough, unchanged from baseline Unable to wean off oxygen - Oxygen saturation on room air while  sitting at the edge of the bed and standing was 87%, while ambulating on 2 L/min oxygen was 92%.   She may have to be discharged home on oxygen Will do ambulatory pulse ox in AM  Hypothyroidism Continue Synthroid  Urinary incontinence Continue oxybutynin  Obesity -Body mass index is 32.03 kg/m..  -Weight loss should be encouraged -Outpatient PCP/bariatric medicine f/u encouraged      Subjective: Patient reports possibly feeling a little better with LLE cellulitis.  No new complaints.  No fever.  Not able to provide a great history on timing of rash, but it doesn't seem to be getting worse.  Unable to comment on her lip desquamation and the timing there.  Physical Exam: Vitals:   11/23/22 0000 11/23/22 0200 11/23/22 0400 11/23/22 0741  BP: (!) 110/55  (!) 118/55 (!) 110/57  Pulse: 88 100 87 80  Resp: 18 (!) 33 15 16  Temp: 98.8 F (37.1 C)  98.1 F (36.7 C) 98.2 F (36.8 C)  TempSrc: Oral  Oral Oral  SpO2: 92% (!) 89% 91% 93%  Weight:      Height:       General:  Appears calm and comfortable and is in NAD but with impressive diffuse rash and lip desquamation noted; lip issue markedly improved on recheck after use of Blistex so unlikely SJS Eyes:  EOMI, normal lids, iris ENT:  grossly normal hearing, lips & tongue, mmm, mouth erythema without further apparent mucosal involvement other than lip desquamation; artificial dentition Neck:  no LAD, masses or thyromegaly Cardiovascular:  RRR, no m/r/g. No LE edema.  Respiratory:   CTA bilaterally  with no wheezes/rales/rhonchi.  Normal respiratory effort. Abdomen:  soft, NT, ND Skin:  diffuse rash on torso, arms, legs; somewhat sparing of face Musculoskeletal:  grossly normal tone BUE/BLE, good ROM, no bony abnormality Psychiatric:  grossly normal mood and affect, speech fluent and appropriate, AOx3 Neurologic:  CN 2-12 grossly intact, moves all extremities in coordinated fashion  Radiological Exams on Admission: Independently  reviewed - see discussion in A/P where applicable  No results found.  EKG: Iast on 6/25   Labs on Admission: I have personally reviewed the available labs and imaging studies at the time of the admission.  Pertinent labs:    Na++ 133 - stable Normal CBC  Family Communication: None present; I called and left a message for her son   Disposition: Status is: Inpatient Remains inpatient appropriate because: ongoing need for monitoring with antibiotics  Planned Discharge Destination:  TBD - home vs. SNF rehab    Time spent: 50 minutes  Author: Jonah Blue, MD 11/23/2022 8:05 AM  For on call review [email or web address].

## 2022-11-23 NOTE — Progress Notes (Signed)
Physical Therapy Treatment Patient Details Name: Catherine Wallace MRN: [id number] DOB: 03-18-1951 Today's Date: 11/23/2022   History of Present Illness 72 y.o. female presents to [facility] hospital on 11/19/2022 with LLE swelling, edema and drainage which began about 2 weeks prior to admission. She also complained of rash that developed about 2 days prior to admission. PMH includes COPD, chronic venous insufficiency, chronic back pain, hypothyroidism.    PT Comments    Pt greeted up in chair and agreeable to session with good progress towards acute goals. Pt requiring grossly min guard-min A for transfers and gait with RW for support. Pt continues to be limited by decreased activity tolerance, impaired balance/postural reactions and decreased cardiopulmonary endurance. Educated pt on importance and benefits of continued and agressive mobilization with pt verbalizing understanding. Current plan remains appropriate to address deficits and maximize functional independence and safety. Pt continues to benefit from skilled PT services to progress toward functional mobility goals.    Recommendations for follow up therapy are one component of a multi-disciplinary discharge planning process, led by the attending physician.  Recommendations may be updated based on patient status, additional functional criteria and insurance authorization.  Follow Up Recommendations  Can patient physically be transported by private vehicle: No    Assistance Recommended at Discharge Intermittent Supervision/Assistance  Patient can return home with the following A lot of help with walking and/or transfers;A lot of help with bathing/dressing/bathroom;Assistance with cooking/housework;Assist for transportation;Help with stairs or ramp for entrance   Equipment Recommendations  Other (comment) (TBD pending progress)    Recommendations for Other Services       Precautions / Restrictions Precautions Precautions: Fall Precaution  Comments: watch O2, LLE pain Restrictions Weight Bearing Restrictions: No     Mobility  Bed Mobility Overal bed mobility: Needs Assistance Bed Mobility: Sit to Supine       Sit to supine: Min assist   General bed mobility comments: assist to return to LEs to bed    Transfers Overall transfer level: Needs assistance Equipment used: Rolling walker (2 wheels) Transfers: Sit to/from Stand, Bed to chair/wheelchair/BSC Sit to Stand: Min assist, Min guard   Step pivot transfers: Min guard       General transfer comment: min A to rise from recliner and min guard from EOB, min guard to step pivot with RW support    Ambulation/Gait Ambulation/Gait assistance: Min guard, Min assist Gait Distance (Feet): 30 Feet Assistive device: Rolling walker (2 wheels) Gait Pattern/deviations: Step-through pattern, Decreased stride length Gait velocity: decr     General Gait Details: slow but generally steady gait with RW support, no LOB, distance limited by pt fatigue   Stairs             Wheelchair Mobility    Modified Rankin (Stroke Patients Only)       Balance Overall balance assessment: Needs assistance Sitting-balance support: No upper extremity supported, Feet unsupported Sitting balance-Leahy Scale: Fair Sitting balance - Comments: sitting EOB   Standing balance support: Bilateral upper extremity supported, Reliant on assistive device for balance Standing balance-Leahy Scale: Poor Standing balance comment: BUE support on RW for stability                            Cognition Arousal/Alertness: Awake/alert Behavior During Therapy: WFL for tasks assessed/performed Overall Cognitive Status: Within Functional Limits for tasks assessed  General Comments: A+O x4, Some decreased insight into deficits initially but seems to be understanding as she moves that she will be needing more assist than previously.         Exercises      General Comments General comments (skin integrity, edema, etc.): Pt with small dime shaped rashes all over trunk, arms, legs. Pt SpO2 desat to 86% on RA during gait, replaced 2L O2 [place] at end fo session to maintain SpO2 at goal of 92% and greater.      Pertinent Vitals/Pain Pain Assessment Pain Assessment: Faces Faces Pain Scale: Hurts a little bit Pain Location: LLE, generalized Pain Descriptors / Indicators: Discomfort, Grimacing, Guarding, Burning Pain Intervention(s): Monitored during session, Limited activity within patient's tolerance    Home Living                          Prior Function            PT Goals (current goals can now be found in the care plan section) Acute Rehab PT Goals Patient Stated Goal: to regain strength, return to independence PT Goal Formulation: With patient Time For Goal Achievement: 12/04/22 Progress towards PT goals: Progressing toward goals    Frequency    Min 2X/week      PT Plan Current plan remains appropriate    Co-evaluation              AM-PAC PT "6 Clicks" Mobility   Outcome Measure  Help needed turning from your back to your side while in a flat bed without using bedrails?: A Little Help needed moving from lying on your back to sitting on the side of a flat bed without using bedrails?: A Lot Help needed moving to and from a bed to a chair (including a wheelchair)?: A Little Help needed standing up from a chair using your arms (e.g., wheelchair or bedside chair)?: A Little Help needed to walk in hospital room?: A Little Help needed climbing 3-5 steps with a railing? : Total 6 Click Score: 15    End of Session Equipment Utilized During Treatment: Gait belt;Oxygen Activity Tolerance: Patient limited by fatigue;Patient tolerated treatment well Patient left: in bed;with call bell/phone within reach Nurse Communication: Mobility status PT Visit Diagnosis: Other abnormalities of gait and  mobility (R26.89);Muscle weakness (generalized) (M62.81)     Time: 1610-9604 PT Time Calculation (min) (ACUTE ONLY): 21 min  Charges:  $Gait Training: 8-22 mins $Therapeutic Activity: 8-22 mins                     Catherine Wallace R. PTA Acute Rehabilitation Services Office: [phone]   Catalina Antigua 11/23/2022, 12:59 PM

## 2022-11-24 ENCOUNTER — Other Ambulatory Visit (HOSPITAL_COMMUNITY): Payer: Self-pay

## 2022-11-24 DIAGNOSIS — L03116 Cellulitis of left lower limb: Secondary | ICD-10-CM | POA: Diagnosis not present

## 2022-11-24 LAB — CULTURE, BLOOD (ROUTINE X 2)

## 2022-11-24 LAB — CBC WITH DIFFERENTIAL/PLATELET
Abs Immature Granulocytes: 0.04 10*3/uL (ref 0.00–0.07)
Basophils Absolute: 0.1 10*3/uL (ref 0.0–0.1)
Basophils Relative: 1 %
Eosinophils Absolute: 0.7 10*3/uL — ABNORMAL HIGH (ref 0.0–0.5)
Eosinophils Relative: 10 %
HCT: 34.6 % — ABNORMAL LOW (ref 36.0–46.0)
Hemoglobin: 11.5 g/dL — ABNORMAL LOW (ref 12.0–15.0)
Immature Granulocytes: 1 %
Lymphocytes Relative: 21 %
Lymphs Abs: 1.5 10*3/uL (ref 0.7–4.0)
MCH: 31.2 pg (ref 26.0–34.0)
MCHC: 33.2 g/dL (ref 30.0–36.0)
MCV: 93.8 fL (ref 80.0–100.0)
Monocytes Absolute: 0.5 10*3/uL (ref 0.1–1.0)
Monocytes Relative: 7 %
Neutro Abs: 4.3 10*3/uL (ref 1.7–7.7)
Neutrophils Relative %: 60 %
Platelets: 201 10*3/uL (ref 150–400)
RBC: 3.69 MIL/uL — ABNORMAL LOW (ref 3.87–5.11)
RDW: 12.8 % (ref 11.5–15.5)
WBC: 7 10*3/uL (ref 4.0–10.5)
nRBC: 0 % (ref 0.0–0.2)

## 2022-11-24 LAB — BASIC METABOLIC PANEL
Anion gap: 8 (ref 5–15)
BUN: 8 mg/dL (ref 8–23)
CO2: 28 mmol/L (ref 22–32)
Calcium: 8.1 mg/dL — ABNORMAL LOW (ref 8.9–10.3)
Chloride: 101 mmol/L (ref 98–111)
Creatinine, Ser: 0.66 mg/dL (ref 0.44–1.00)
GFR, Estimated: >60 mL/min (ref >60)
Glucose, Bld: 92 mg/dL (ref 70–99)
Potassium: 3.8 mmol/L (ref 3.5–5.1)
Sodium: 137 mmol/L (ref 135–145)

## 2022-11-24 MED ORDER — TRIAMCINOLONE 0.1 % CREAM:EUCERIN CREAM 1:1: 1.0000 | TOPICAL_CREAM | Freq: Two times a day (BID) | CUTANEOUS | 1 refills | Status: AC

## 2022-11-24 MED ORDER — LINEZOLID 600 MG PO TABS
600.0000 mg | ORAL_TABLET | Freq: Two times a day (BID) | ORAL | 0 refills | Status: AC
  Filled 2022-11-24: qty 5, 3d supply, fill #0

## 2022-11-24 MED ORDER — ENSURE ENLIVE PO LIQD
237.0000 mL | Freq: Two times a day (BID) | ORAL | 12 refills | Status: AC
  Filled 2022-11-24: qty 237, 1d supply, fill #0

## 2022-11-24 MED ORDER — TRIAMCINOLONE 0.1 % CREAM:EUCERIN CREAM 1:1
TOPICAL_CREAM | Freq: Two times a day (BID) | CUTANEOUS | Status: DC
  Filled 2022-11-24 (×2): qty 1

## 2022-11-24 MED ORDER — MIDODRINE HCL 5 MG PO TABS
5.0000 mg | ORAL_TABLET | Freq: Three times a day (TID) | ORAL | 1 refills | Status: AC
  Filled 2022-11-24: qty 90, 30d supply, fill #0

## 2022-11-24 MED ORDER — HYDROXYZINE HCL 10 MG PO TABS
10.0000 mg | ORAL_TABLET | Freq: Three times a day (TID) | ORAL | 0 refills | Status: AC | PRN
  Filled 2022-11-24: qty 30, 10d supply, fill #0

## 2022-11-24 NOTE — Discharge Summary (Signed)
Physician Discharge Summary   Patient: Catherine Wallace MRN: [id number] DOB: [birth year]  Admit date:     11/19/2022  Discharge date: 11/24/22  Discharge Physician: Jonah Blue   PCP: Gordan Payment., MD   Recommendations at discharge:   Complete Zyvox (linezolid) - take as directed until gone Wear compression stockings Lower leg cream (Eucerin with triamcinolone) has been called in to your pharmacy Take hydroxyzine and use hydrocortisone cream as needed for itching Follow up with orthopedics in 1 week Follow up with PCP in 1-2 weeks Home health (PT, OT, nursing, and respiratory care) has been ordered STOP SMOKING! Home oxygen has been ordered  Discharge Diagnoses: Principal Problem:   Cellulitis Active Problems:   Acute renal failure (ARF) (HCC)   Rash   Dehydration   Sepsis (HCC)   Hypothyroidism   Tobacco abuse   COPD (chronic obstructive pulmonary disease) (HCC)   Venous insufficiency (chronic) (peripheral)   Lymphedema   Hospital Course: 72yo female with h/o COPD, chronic BLE venous insufficiency, chronic back pain, and hypothyroidism who presented on 6/24 with LLE cellulitis that failed outpatient antibiotics.  She was found to be in septic shock and required midodrine, now tapering.  Treated with Vanc/ceftriaxone.  DVT US and MRI negative.    Assessment and Plan:  Septic shock associated with LLE cellulitis Patient with severe sepsis with hypotension on presentation Persistent LLE cellulitis despite treatment for 7 days prior with Bactrim and cephazolin Has stasis with dermatitis, easy portal of entry for infection BP has improved, tapering off midodrine as able (decreased from 10 mg to 5 mg 3 times daily) Venous duplex negative for DVT and MRI of the leg did not show any evidence of abscess Has been treated with IV vancomycin and ceftriaxone -> transitioned to PO Zyvox on 6/28 with hope for ongoing recovery and dc on this medication; will need 7 total days of  treatment Appreciate input from orthopedic surgeon, Dr. Lajoyce Corners recommends compression socks (changing daily) Will discharge to home today as she has had ongoing improvement - she has declined going to SNF and prefers to go home (we discussed at length again today), has made some mobility improvement Will add Eucerin:TAC to BLE   Rash Appears to be a drug rash, as it is diffuse on all exposed body surfaces It did not appear to be significantly improved despite cessation of offending agent (PCN and Bactrim allergies listed, patient does not think rash was present on admission so possibly from ceftriaxone?) Stopped parenteral abx on 6/28 with improvement in rash Continue hydroxyzine and hydrocortisone cream as needed for itching   AKI  Creatinine has improved/normalized Attempt to avoid nephrotoxic medications   COPD Does not wear home O2 - but previously did use it and decided she didn't want it anymore Has chronic cough, unchanged from baseline Unable to wean off oxygen - Oxygen saturation on room air while sitting at the edge of the bed and standing was 87%, while ambulating on 2 L/min oxygen was 92%.   Based on documented ambulatory pulse ox, she needs home O2 (again) Smoking cessation encouraged    Hypothyroidism Continue Synthroid   Urinary incontinence Continue oxybutynin   Obesity Body mass index is 32.03 kg/m.Marland Kitchen  Weight loss should be encouraged Outpatient PCP/bariatric medicine f/u encouraged           Pain control - [facility] Controlled Substance Reporting System database was reviewed. and patient was instructed, not to drive, operate heavy machinery, perform activities at heights, swimming or  participation in water activities or provide baby-sitting services while on Pain, Sleep and Anxiety Medications; until their outpatient Physician has advised to do so again. Also recommended to not to take more than prescribed Pain, Sleep and Anxiety Medications.    Consultants: Orthopedics; PT/OT/TOC team Procedures performed: Echo, DVT US  Disposition: Home Diet recommendation:  Regular diet DISCHARGE MEDICATION: Allergies as of 11/24/2022       Reactions   Rocephin [ceftriaxone] Rash   Patient was also on Keflex and Bactrim x 7 days prior to admission on 6/24, then on cefepime x 2 days. Rash documented on 6/26 (picture). Uncertain which drug is the culprit.   Tape Rash   Penicillins Nausea And Vomiting   Amoxicillin Rash   Hydrocodone Rash   Sulfamethoxazole-trimethoprim Rash        Medication List     STOP taking these medications    cephALEXin 250 MG capsule Commonly known as: KEFLEX       TAKE these medications    ALIVE WOMENS 50+ COMPLETE MV PO Take 1 tablet by mouth daily.   carboxymethylcellulose 0.5 % Soln Commonly known as: REFRESH PLUS Place 2 drops into both eyes 3 (three) times daily.   feeding supplement Liqd Take 237 mLs by mouth 2 (two) times daily between meals.   furosemide 20 MG tablet Commonly known as: LASIX Take 10 mg by mouth every other day.   hydrOXYzine 10 MG tablet Commonly known as: ATARAX Take 1 tablet (10 mg total) by mouth 3 (three) times daily as needed for itching.   levothyroxine 25 MCG tablet Commonly known as: SYNTHROID Take 25 mcg by mouth daily before breakfast.   linezolid 600 MG tablet Commonly known as: ZYVOX Take 1 tablet (600 mg total) by mouth every 12 (twelve) hours.   loratadine 10 MG tablet Commonly known as: CLARITIN Take 10 mg by mouth daily as needed for allergies.   midodrine 5 MG tablet Commonly known as: PROAMATINE Take 1 tablet (5 mg total) by mouth 3 (three) times daily with meals.   oxybutynin 5 MG 24 hr tablet Commonly known as: DITROPAN-XL Take 5 mg by mouth daily.   triamcinolone 0.1 % cream : eucerin Crea Apply 1 Application topically 2 (two) times daily.   Vitamin D 50 MCG (2000 UT) tablet Take 2,000 Units by mouth daily.                Durable Medical Equipment  (From admission, onward)           Start     Ordered   11/24/22 1456  DME Oxygen  Once       Question Answer Comment  Length of Need Lifetime   Mode or (Route) Nasal cannula   Liters per Minute 2   Frequency Continuous (stationary and portable oxygen unit needed)   Oxygen conserving device Yes   Oxygen delivery system Gas      11/24/22 1458            Follow-up Information     Nadara Mustard, MD Follow up in 1 week(s).   Specialty: Orthopedic Surgery Contact information: [address] [place] [address] [phone]                Discharge Exam: Ceasar Mons Weights   11/19/22 2046  Weight: 67.1 kg   Subjective: She is concerned about going home since she lives alone.  Rash of body as well as cellulitis are both clearly better.  She has been up  to the bathroom and walked some in the halls.  She acknowledges that she was previously on home O2 and gave it up because she didn't think she needed it.  She was smoking PTA.   Physical Exam:       Vitals:    11/23/22 1141 11/23/22 1545 11/23/22 2000 11/23/22 2325  BP: (!) 101/46 (!) 101/59 (!) 100/50 (!) 120/58  Pulse: 85 85 86 80  Resp: 18 17 20 12   Temp: 98 F (36.7 C) 97.8 F (36.6 C) 98 F (36.7 C) 97.9 F (36.6 C)  TempSrc: Oral Oral Oral Oral  SpO2: 92% 93% 94% 94%  Weight:          Height:            General:  Appears calm and comfortable and is in NAD, [place] O2 turned to off to monitor sats and will need ambulatory pulse ox Eyes:   EOMI, normal lids, iris ENT:  grossly normal hearing, lips & tongue, mmm Neck:  no LAD, masses or thyromegaly Cardiovascular:  RRR, no m/r/g. No LE edema.  Respiratory:   CTA bilaterally with no wheezes/rales/rhonchi.  Normal respiratory effort. Abdomen:  soft, NT, ND Skin:  clear improvement of LLE cellulitis compared to prior; diffuse stasis dermatitis of L > R lower legs; improvement in diffuse rash also noted    Musculoskeletal:   grossly normal tone BUE/BLE, good ROM, no bony abnormality Psychiatric:  blunted mood and affect, speech fluent and appropriate, AOx3 Neurologic:  CN 2-12 grossly intact, moves all extremities in coordinated fashion but with some difficulty due to generalized weakness     Pertinent labs:     Unremarkable BMP WBC 7 Hgb 11.5  Condition at discharge: improving  The results of significant diagnostics from this hospitalization (including imaging, microbiology, ancillary and laboratory) are listed below for reference.   Imaging Studies: VAS [place] LOWER EXTREMITY VENOUS (DVT) (ONLY [facility] & [facility])  Result Date: 11/20/2022  Lower Venous DVT Study Patient Name:  ROSHNI MCCRITE  Date of Exam:   11/20/2022 Medical Rec #: [id number]   Accession #:    [id number] Date of Birth: [birth year]    Patient Gender: F Patient Age:   56 years Exam Location:  [facility] Procedure:      VAS [place] LOWER EXTREMITY VENOUS (DVT) Referring Phys: Jonny Ruiz DOUTOVA --------------------------------------------------------------------------------  Indications: Pain, Erythema, Warmth and Edema. (Cellulitis)  Limitations: Body habitus and patient movement. Comparison Study: Previous exams performed at outside facilities - unable to                   view reports. Performing Technologist: Ernestene Mention RVT, RDMS  Examination Guidelines: A complete evaluation includes B-mode imaging, spectral Doppler, color Doppler, and power Doppler as needed of all accessible portions of each vessel. Bilateral testing is considered an integral part of a complete examination. Limited examinations for reoccurring indications may be performed as noted. The reflux portion of the exam is performed with the patient in reverse Trendelenburg.  +---------+---------------+---------+-----------+----------+--------------+ RIGHT    CompressibilityPhasicitySpontaneityPropertiesThrombus Aging +---------+---------------+---------+-----------+----------+--------------+ CFV       Full           Yes      Yes                                 +---------+---------------+---------+-----------+----------+--------------+ SFJ      Full                                                        +---------+---------------+---------+-----------+----------+--------------+  FV Prox  Full           Yes      Yes                                 +---------+---------------+---------+-----------+----------+--------------+ FV Mid   Full           Yes      Yes                                 +---------+---------------+---------+-----------+----------+--------------+ FV DistalFull           Yes      Yes                                 +---------+---------------+---------+-----------+----------+--------------+ PFV      Full                                                        +---------+---------------+---------+-----------+----------+--------------+ POP      Full           Yes      Yes                                 +---------+---------------+---------+-----------+----------+--------------+ PTV      Full                                                        +---------+---------------+---------+-----------+----------+--------------+ PERO     Full                                                        +---------+---------------+---------+-----------+----------+--------------+   +---------+---------------+---------+-----------+----------+-------------------+ LEFT     CompressibilityPhasicitySpontaneityPropertiesThrombus Aging      +---------+---------------+---------+-----------+----------+-------------------+ CFV      Full           Yes      Yes                                      +---------+---------------+---------+-----------+----------+-------------------+ SFJ      Full                                                             +---------+---------------+---------+-----------+----------+-------------------+ FV Prox  Full            Yes      Yes                                      +---------+---------------+---------+-----------+----------+-------------------+  FV Mid   Full           Yes      Yes                                      +---------+---------------+---------+-----------+----------+-------------------+ FV DistalFull           Yes      Yes                                      +---------+---------------+---------+-----------+----------+-------------------+ PFV      Full                                                             +---------+---------------+---------+-----------+----------+-------------------+ POP      Full           Yes      Yes                                      +---------+---------------+---------+-----------+----------+-------------------+ PTV                                                   Not well visualized +---------+---------------+---------+-----------+----------+-------------------+ PERO                                                  Not well visualized +---------+---------------+---------+-----------+----------+-------------------+     Summary: BILATERAL: - No evidence of deep vein thrombosis seen in the lower extremities, bilaterally. -No evidence of popliteal cyst, bilaterally.   *See table(s) above for measurements and observations. Electronically signed by Lemar Livings MD on 11/20/2022 at 4:08:18 PM.    Final    ECHOCARDIOGRAM COMPLETE  Result Date: 11/20/2022    ECHOCARDIOGRAM REPORT   Patient Name:   YULMA Cella Date of Exam: 11/20/2022 Medical Rec #:  [id number]  Height:       57.0 in Accession #:    [id number] Weight:       148.0 lb Date of Birth:  03/14/1951   BSA:          1.583 m Patient Age:    72 years   BP:           88/53 mmHg Patient Gender: F          HR:           69 bpm. Exam Location:  Inpatient Procedure: 2D Echo, Cardiac Doppler and Color Doppler Indications:    Fever  History:        Patient has no prior history of Echocardiogram  examinations.                 COPD.  Sonographer:    Darlys Gales Referring Phys: [id number] ANASTASSIA DOUTOVA IMPRESSIONS  1. Left ventricular ejection fraction, by estimation,  is 60 to 65%. The left ventricle has normal function. The left ventricle has no regional wall motion abnormalities. Left ventricular diastolic parameters are indeterminate.  2. Right ventricular systolic function is normal. The right ventricular size is normal.  3. The mitral valve is normal in structure. Trivial mitral valve regurgitation. No evidence of mitral stenosis.  4. The aortic valve is normal in structure. Aortic valve regurgitation is not visualized. No aortic stenosis is present.  5. The inferior vena cava is normal in size with greater than 50% respiratory variability, suggesting right atrial pressure of 3 mmHg. FINDINGS  Left Ventricle: Left ventricular ejection fraction, by estimation, is 60 to 65%. The left ventricle has normal function. The left ventricle has no regional wall motion abnormalities. The left ventricular internal cavity size was normal in size. There is  no left ventricular hypertrophy. Left ventricular diastolic parameters are indeterminate. Right Ventricle: The right ventricular size is normal. No increase in right ventricular wall thickness. Right ventricular systolic function is normal. Left Atrium: Left atrial size was normal in size. Right Atrium: Right atrial size was normal in size. Pericardium: There is no evidence of pericardial effusion. Mitral Valve: The mitral valve is normal in structure. Trivial mitral valve regurgitation. No evidence of mitral valve stenosis. Tricuspid Valve: The tricuspid valve is normal in structure. Tricuspid valve regurgitation is not demonstrated. No evidence of tricuspid stenosis. Aortic Valve: The aortic valve is normal in structure. Aortic valve regurgitation is not visualized. No aortic stenosis is present. Aortic valve mean gradient measures 4.0 mmHg. Aortic valve peak  gradient measures 7.0 mmHg. Pulmonic Valve: The pulmonic valve was normal in structure. Pulmonic valve regurgitation is not visualized. No evidence of pulmonic stenosis. Aorta: The aortic root is normal in size and structure. Venous: The inferior vena cava is normal in size with greater than 50% respiratory variability, suggesting right atrial pressure of 3 mmHg. IAS/Shunts: No atrial level shunt detected by color flow Doppler.  LEFT VENTRICLE PLAX 2D LVIDd:         3.70 cm Diastology LVIDs:         2.40 cm LV e' medial:    8.05 cm/s LV PW:         1.20 cm LV E/e' medial:  14.3 LV IVS:        1.00 cm LV e' lateral:   10.80 cm/s                        LV E/e' lateral: 10.6  RIGHT VENTRICLE             IVC RV S prime:     14.10 cm/s  IVC diam: 2.10 cm TAPSE (M-mode): 2.9 cm LEFT ATRIUM             Index        RIGHT ATRIUM           Index LA Vol (A2C):   31.3 ml 19.78 ml/m  RA Area:     13.20 cm LA Vol (A4C):   23.7 ml 14.98 ml/m  RA Volume:   32.90 ml  20.79 ml/m LA Biplane Vol: 29.7 ml 18.77 ml/m  AORTIC VALVE AV Vmax:           132.00 cm/s AV Vmean:          88.200 cm/s AV VTI:            0.288 m AV Peak Grad:      7.0  mmHg AV Mean Grad:      4.0 mmHg LVOT Vmax:         139.00 cm/s LVOT Vmean:        85.200 cm/s LVOT VTI:          0.296 m LVOT/AV VTI ratio: 1.03  AORTA Ao Asc diam: 2.80 cm MITRAL VALVE                TRICUSPID VALVE MV Area (PHT): 3.99 cm     TR Peak grad:   22.3 mmHg MV Decel Time: 190 msec     TR Vmax:        236.00 cm/s MV E velocity: 115.00 cm/s MV A velocity: 111.00 cm/s  SHUNTS MV E/A ratio:  1.04         Systemic VTI: 0.30 m Lavona Mound Tobb DO Electronically signed by Thomasene Ripple DO Signature Date/Time: 11/20/2022/2:59:53 PM    Final    MR TIBIA FIBULA LEFT W WO CONTRAST  Result Date: 11/20/2022 CLINICAL DATA:  History of venous insufficiency. Chronic bilateral lower extremity edema. EXAM: MRI OF LOWER LEFT EXTREMITY WITHOUT AND WITH CONTRAST TECHNIQUE: Multiplanar, multisequence MR  imaging of the left was performed both before and after administration of intravenous contrast. CONTRAST:  6.16mL GADAVIST GADOBUTROL 1 MMOL/ML IV SOLN COMPARISON:  Radiograph dated November 19, 2022 FINDINGS: Bones/Joint/Cartilage Marrow signal is within normal limits. No fracture or osteonecrosis. Ligaments Intact Muscles and Tendons Muscles are normal in bulk. No intramuscular hematoma. No abnormal enhancement. Mild edema of the medial and lateral head of the gastrocnemius without evidence of fluid collection or abnormal enhancement. Tendons of the flexor, extensor and peroneal compartments appear intact. Soft tissues There is marked skin thickening and subcutaneous soft tissue edema. No fluid collection or abscess. IMPRESSION: 1. Marked skin thickening and subcutaneous soft tissue edema consistent with cellulitis. No fluid collection or abscess. 2. Mild edema of the medial and lateral head of the gastrocnemius muscles without evidence of fluid collection or abnormal enhancement. 3. No evidence of osteomyelitis. Electronically Signed   By: Larose Hires D.O.   On: 11/20/2022 12:39   DG Tibia/Fibula Left  Result Date: 11/19/2022 CLINICAL DATA:  Questionable sepsis - evaluate for abnormality EXAM: LEFT TIBIA AND FIBULA - 2 VIEW COMPARISON:  January 28, 2019 FINDINGS: No acute fracture or dislocation. Joint spaces and alignment are maintained. No area of erosion or osseous destruction. No unexpected radiopaque foreign body. Soft tissue edema. IMPRESSION: 1. No acute fracture or dislocation. 2. Soft tissue edema. Electronically Signed   By: Meda Klinefelter M.D.   On: 11/19/2022 21:29   DG Chest Port 1 View  Result Date: 11/19/2022 CLINICAL DATA:  Questionable sepsis - evaluate for abnormality EXAM: PORTABLE CHEST 1 VIEW COMPARISON:  February 17, 2019 FINDINGS: The cardiomediastinal silhouette is unchanged in contour.Atherosclerotic calcifications of the tortuous thoracic aorta. No pleural effusion. No  pneumothorax. No acute pleuroparenchymal abnormality. IMPRESSION: No acute cardiopulmonary abnormality. Electronically Signed   By: Meda Klinefelter M.D.   On: 11/19/2022 21:29    Microbiology: Results for orders placed or performed during the hospital encounter of 11/19/22  Blood Culture (routine x 2)     Status: None   Collection Time: 11/19/22  8:25 PM   Specimen: BLOOD  Result Value Ref Range Status   Specimen Description BLOOD RIGHT ANTECUBITAL  Final   Special Requests   Final    BOTTLES DRAWN AEROBIC AND ANAEROBIC Blood Culture results may not be optimal due to an excessive volume  of blood received in culture bottles   Culture   Final    NO GROWTH 5 DAYS Performed at [facility], [address], [place], [place] [address]    Report Status 11/24/2022 FINAL  Final  Blood Culture (routine x 2)     Status: None   Collection Time: 11/19/22  8:35 PM   Specimen: BLOOD  Result Value Ref Range Status   Specimen Description BLOOD LEFT ANTECUBITAL  Final   Special Requests   Final    BOTTLES DRAWN AEROBIC AND ANAEROBIC Blood Culture results may not be optimal due to an inadequate volume of blood received in culture bottles   Culture   Final    NO GROWTH 5 DAYS Performed at [facility], [address], [place], [place] [address]    Report Status 11/24/2022 FINAL  Final  MRSA Next Gen by PCR, Nasal     Status: None   Collection Time: 11/20/22 12:45 AM   Specimen: Nasal Mucosa; Nasal Swab  Result Value Ref Range Status   MRSA by PCR Next Gen NOT DETECTED NOT DETECTED Final    Comment: (NOTE) The GeneXpert MRSA Assay (FDA approved for NASAL specimens only), is one component of a comprehensive MRSA colonization surveillance program. It is not intended to diagnose MRSA infection nor to guide or monitor treatment for MRSA infections. Test performance is not FDA approved in patients less than 22 years old. Performed at [facility], [address] [place],  [place] [address]      Discharge time spent: greater than 30 minutes.  Signed: Jonah Blue, MD [facility] 11/24/2022

## 2022-11-24 NOTE — Progress Notes (Signed)
SATURATION QUALIFICATIONS: (This note is used to comply with regulatory documentation for home oxygen)  Patient Saturations on Room Air at Rest = 87%  Patient Saturations on Room Air while Ambulating = 86%  Patient Saturations on 2l oxygen at Rest = 95%  Patient Saturations on 2 Liters of oxygen while Ambulating = 94%  Please briefly explain why patient needs home oxygen:

## 2022-11-24 NOTE — Plan of Care (Signed)

## 2022-11-24 NOTE — TOC Transition Note (Addendum)
Transition of Care [facility]) - CM/SW Discharge Note   Patient Details  Name: [name] MRN: [id number] Date of Birth: 02/05/1951  [facility] CM/SW Contact:  Ronny Bacon, RN Phone Number: 11/24/2022, 3:17 PM   Clinical Narrative:   Patient being discharged home today. Oxygen needs noted and oxygen ordered through [facility]. HH PT, OT, RN, and Aide arrangement attempted using [facility], awaiting response.  1528: [facility] able to accept patient for [facility] services, PT, OT, RN, Aide, Respiratory care.    Final next level of care: Home w Home Health Services Barriers to Discharge: No Barriers Identified   Patient Goals and CMS Choice CMS Medicare.gov Compare Post Acute Care list provided to:: Patient Choice offered to / list presented to : Patient  Discharge Placement                         Discharge Plan and Services Additional resources added to the After Visit Summary for   In-house Referral: Clinical Social Work Discharge Planning Services: NA Post Acute Care Choice: NA          DME Arranged: Oxygen DME Agency: [employer] Date DME Agency Contacted: 11/24/22 Time DME Agency Contacted: 1513 Representative spoke with at DME Agency: Vaughan Basta HH Arranged:  (RECS TBD) HH Agency:  (RECS TBD)        Social Determinants of Health (SDOH) Interventions SDOH Screenings   Tobacco Use: High Risk (11/20/2022)     Readmission Risk Interventions     No data to display

## 2022-11-26 ENCOUNTER — Other Ambulatory Visit (HOSPITAL_COMMUNITY): Payer: Self-pay

## 2024-05-12 ENCOUNTER — Ambulatory Visit (HOSPITAL_BASED_OUTPATIENT_CLINIC_OR_DEPARTMENT_OTHER)
Admission: EM | Admit: 2024-05-12 | Discharge: 2024-05-12 | Disposition: A | Discharge status: Home / Self Care | Source: Home / Self Care

## 2024-05-12 ENCOUNTER — Encounter (HOSPITAL_BASED_OUTPATIENT_CLINIC_OR_DEPARTMENT_OTHER): Payer: Self-pay

## 2024-05-12 ENCOUNTER — Other Ambulatory Visit (HOSPITAL_BASED_OUTPATIENT_CLINIC_OR_DEPARTMENT_OTHER): Payer: Self-pay

## 2024-05-12 DIAGNOSIS — J0110 Acute frontal sinusitis, unspecified: Secondary | ICD-10-CM

## 2024-05-12 MED ORDER — DOXYCYCLINE HYCLATE 100 MG PO CAPS
100.0000 mg | ORAL_CAPSULE | Freq: Two times a day (BID) | ORAL | 0 refills | Status: AC
  Filled 2024-05-12: qty 14, 7d supply, fill #0

## 2024-05-12 NOTE — Discharge Instructions (Addendum)
 Take the antibiotics as prescribed for sinus infection. Recommend over-the-counter Flonase and Zyrtec for sinus congestion and postnasal drainage. You could also do Mucinex. Follow-up as needed

## 2024-05-12 NOTE — ED Provider Notes (Signed)
 [facility]    CSN: [id number] Arrival date & time: 05/12/24  1425      History   Chief Complaint Chief Complaint  Patient presents with   Cough    HPI Catherine Wallace is a 73 y.o. female.   Pt is a 73 year old female that presents with cough, chest congestion x 2.5 weeks. Spoke to pcp office on 12/11 but no appointments available. No fevers, mild ear pain. + smoker. 1ppd.    Cough   History reviewed. No pertinent past medical history.  Patient Active Problem List   Diagnosis Date Noted   Venous insufficiency (chronic) (peripheral) 11/21/2022   Lymphedema 11/21/2022   Cellulitis 11/19/2022   Dehydration 11/19/2022   Sepsis (HCC) 11/19/2022   Hypothyroidism 11/19/2022   Tobacco abuse 11/19/2022   COPD (chronic obstructive pulmonary disease) (HCC) 11/19/2022   Acute renal failure (ARF) 03/04/2019   AKI (acute kidney injury)    Fall    Localized skin desquamation    Rash     History reviewed. No pertinent surgical history.  OB History   No obstetric history on file.      Home Medications    Prior to Admission medications  Medication Sig Start Date End Date Taking? Authorizing Provider  doxycycline  (VIBRAMYCIN ) 100 MG capsule Take 1 capsule (100 mg total) by mouth 2 (two) times daily for 7 days. 05/12/24 05/19/24 Yes Gibran Veselka A, FNP  carboxymethylcellulose (REFRESH PLUS) 0.5 % SOLN Place 2 drops into both eyes 3 (three) times daily.     [provider]  Cholecalciferol (VITAMIN D) 50 MCG (2000 UT) tablet Take 2,000 Units by mouth daily.    [provider]  feeding supplement (ENSURE ENLIVE / ENSURE PLUS) LIQD Take 237 mLs by mouth 2 (two) times daily between meals. 11/24/22   Barbarann Nest, MD  furosemide (LASIX) 20 MG tablet Take 10 mg by mouth every other day. 04/25/22   [provider]  hydrOXYzine  (ATARAX ) 10 MG tablet Take 1 tablet (10 mg total) by mouth 3 (three) times daily as needed for itching. 11/24/22   Barbarann Nest, MD  levothyroxine  (SYNTHROID ) 25 MCG tablet Take 25 mcg by mouth daily before breakfast.    [provider]  linezolid  (ZYVOX ) 600 MG tablet Take 1 tablet (600 mg total) by mouth every 12 (twelve) hours. 11/24/22   Barbarann Nest, MD  loratadine (CLARITIN) 10 MG tablet Take 10 mg by mouth daily as needed for allergies.    [provider]  midodrine  (PROAMATINE ) 5 MG tablet Take 1 tablet (5 mg total) by mouth 3 (three) times daily with meals. 11/24/22   Barbarann Nest, MD  Multiple Vitamins-Minerals (ALIVE WOMENS 50+ COMPLETE MV PO) Take 1 tablet by mouth daily.    [provider]  oxybutynin  (DITROPAN -XL) 5 MG 24 hr tablet Take 5 mg by mouth daily. 12/11/18   [provider]  Triamcinolone  Acetonide (TRIAMCINOLONE  0.1 % CREAM : EUCERIN) CREA Apply 1 Application topically 2 (two) times daily. 11/24/22   Barbarann Nest, MD    Family History History reviewed. No pertinent family history.  Social History Social History[1]   Allergies   Rocephin  [ceftriaxone ], Tape, Penicillins, Amoxicillin, Hydrocodone , and Sulfamethoxazole-trimethoprim   Review of Systems Review of Systems  Respiratory:  Positive for cough.      Physical Exam Triage Vital Signs ED Triage Vitals  Encounter Vitals Group     BP 05/12/24 1439 118/77     Girls Systolic BP Percentile --  Girls Diastolic BP Percentile --      Boys Systolic BP Percentile --      Boys Diastolic BP Percentile --      Pulse Rate 05/12/24 1439 65     Resp 05/12/24 1439 20     Temp 05/12/24 1439 98.5 F (36.9 C)     Temp Source 05/12/24 1439 Oral     SpO2 05/12/24 1439 95 %     Weight --      Height --      Head Circumference --      Peak Flow --      Pain Score 05/12/24 1441 0     Pain Loc --      Pain Education --      Exclude from Growth Chart --    No data found.  Updated Vital Signs BP 118/77 (BP Location: Right Arm)   Pulse 65   Temp 98.5 F (36.9 C) (Oral)   Resp 20    SpO2 95%   Visual Acuity Right Eye Distance:   Left Eye Distance:   Bilateral Distance:    Right Eye Near:   Left Eye Near:    Bilateral Near:     Physical Exam Constitutional:      General: She is not in acute distress.    Appearance: Normal appearance. She is not ill-appearing, toxic-appearing or diaphoretic.  HENT:     Head: Normocephalic and atraumatic.     Right Ear: Tympanic membrane and ear canal normal.     Left Ear: Tympanic membrane and ear canal normal.     Nose: Congestion and rhinorrhea present.     Mouth/Throat:     Pharynx: Oropharynx is clear.  Eyes:     Conjunctiva/sclera: Conjunctivae normal.  Cardiovascular:     Rate and Rhythm: Normal rate and regular rhythm.     Pulses: Normal pulses.     Heart sounds: Normal heart sounds.  Pulmonary:     Effort: Pulmonary effort is normal.     Breath sounds: Normal breath sounds.  Skin:    General: Skin is warm and dry.  Neurological:     Mental Status: She is alert.  Psychiatric:        Mood and Affect: Mood normal.      UC Treatments / Results  Labs (all labs ordered are listed, but only abnormal results are displayed) Labs Reviewed - No data to display  EKG   Radiology No results found.  Procedures Procedures (including critical care time)  Medications Ordered in UC Medications - No data to display  Initial Impression / Assessment and Plan / UC Course  I have reviewed the triage vital signs and the nursing notes.  Pertinent labs & imaging results that were available during my care of the patient were reviewed by me and considered in my medical decision making (see chart for details).     Acute sinusitis- antibiotics as prescribed for sinus infection. Recommend over-the-counter Flonase and Zyrtec for sinus congestion and postnasal drainage. You could also do Mucinex. Follow-up as needed Final Clinical Impressions(s) / UC Diagnoses   Final diagnoses:  Acute non-recurrent frontal sinusitis      Discharge Instructions      Take the antibiotics as prescribed for sinus infection. Recommend over-the-counter Flonase and Zyrtec for sinus congestion and postnasal drainage. You could also do Mucinex. Follow-up as needed     ED Prescriptions     Medication Sig Dispense Auth. Provider   doxycycline  (VIBRAMYCIN ) 100  MG capsule Take 1 capsule (100 mg total) by mouth 2 (two) times daily for 7 days. 14 capsule Adah Corning A, FNP      PDMP not reviewed this encounter.     [1]  Social History Tobacco Use   Smoking status: Every Day    Current packs/day: 1.00    Types: Cigarettes   Smokeless tobacco: Never     Adah Corning LABOR, FNP 05/12/24 1617

## 2024-05-12 NOTE — ED Triage Notes (Signed)
 Cough, chest congestion x 2.5 weeks. Spoke to pcp office on 12/11 but no appointments available. No fevers, mild ear pain. + smoker. 1ppd.

## 2024-05-22 ENCOUNTER — Other Ambulatory Visit (HOSPITAL_BASED_OUTPATIENT_CLINIC_OR_DEPARTMENT_OTHER): Payer: Self-pay
